# Patient Record
Sex: Male | Born: 1985 | Race: White | Hispanic: No | Marital: Married | Smoking: Never smoker
Health system: Southern US, Community
[De-identification: ages and names within clinical notes are randomized; demographics above are authoritative.]

## PROBLEM LIST (undated history)

## (undated) DIAGNOSIS — J302 Other seasonal allergic rhinitis: Secondary | ICD-10-CM

## (undated) DIAGNOSIS — F419 Anxiety disorder, unspecified: Secondary | ICD-10-CM

## (undated) DIAGNOSIS — E785 Hyperlipidemia, unspecified: Secondary | ICD-10-CM

## (undated) DIAGNOSIS — L719 Rosacea, unspecified: Secondary | ICD-10-CM

## (undated) DIAGNOSIS — K219 Gastro-esophageal reflux disease without esophagitis: Secondary | ICD-10-CM

## (undated) HISTORY — DX: Gastro-esophageal reflux disease without esophagitis: K21.9

## (undated) HISTORY — DX: Rosacea, unspecified: L71.9

## (undated) HISTORY — DX: Other seasonal allergic rhinitis: J30.2

## (undated) HISTORY — DX: Anxiety disorder, unspecified: F41.9

## (undated) HISTORY — PX: ESOPHAGOGASTRODUODENOSCOPY ENDOSCOPY: SHX5814

## (undated) HISTORY — DX: Hyperlipidemia, unspecified: E78.5

---

## 2015-04-03 ENCOUNTER — Ambulatory Visit (INDEPENDENT_AMBULATORY_CARE_PROVIDER_SITE_OTHER): Payer: Managed Care, Other (non HMO) | Admitting: Adult Health

## 2015-04-03 ENCOUNTER — Encounter: Payer: Self-pay | Admitting: Adult Health

## 2015-04-03 VITALS — BP 118/72 | Temp 98.3°F | Ht 67.25 in | Wt 181.1 lb

## 2015-04-03 DIAGNOSIS — Z0000 Encounter for general adult medical examination without abnormal findings: Secondary | ICD-10-CM

## 2015-04-03 NOTE — Progress Notes (Signed)
    Patient presents to clinic today to establish care. He is a pleasant Caucasian male who  has a past medical history of Seasonal allergies.   Acute Concerns: Complete Physical   Chronic Issues: None   Health Maintenance: Dental -- Does not have a dentist  Vision -- [employer] [facility]  Immunizations --UTD  Diet: Poor to decent  Exercise: No regular exercise.    Past Medical History  Diagnosis Date  . Seasonal allergies     History reviewed. No pertinent past surgical history.  No current outpatient prescriptions on file prior to visit.   No current facility-administered medications on file prior to visit.    No Known Allergies  Family History  Problem Relation Age of Onset  . Hypertension Father     Social History   Social History  . Marital Status: Single    Spouse Name: N/A  . Number of Children: N/A  . Years of Education: N/A   Occupational History  . Not on file.   Social History Main Topics  . Smoking status: Never Smoker   . Smokeless tobacco: Not on file  . Alcohol Use: 0.0 oz/week    0 Standard drinks or equivalent per week     Comment: beer daily   . Drug Use: No  . Sexual Activity: Not on file   Other Topics Concern  . Not on file   Social History Narrative  . No narrative on file    Review of Systems  Constitutional: Negative.   HENT: Negative.   Eyes: Negative.   Respiratory: Negative.   Cardiovascular: Negative.   Gastrointestinal: Negative.   Genitourinary: Negative.   Musculoskeletal: Negative.   Skin: Negative.   Neurological: Negative.   Endo/Heme/Allergies: Negative.   Psychiatric/Behavioral: Negative.   All other systems reviewed and are negative.      BP 118/72 mmHg  Temp(Src) 98.3 F (36.8 C) (Oral)  Ht 5' 7.25" (1.708 m)  Wt 181 lb 1.6 oz (82.146 kg)  BMI 28.16 kg/m2  Physical Exam  Constitutional: He is oriented to person, place, and time and well-developed, well-nourished, and in no  distress. No distress.  HENT:  Head: Normocephalic and atraumatic.  Right Ear: External ear normal.  Left Ear: External ear normal.  Nose: Nose normal.  Mouth/Throat: Oropharynx is clear and moist.  Cardiovascular: Normal rate, regular rhythm, normal heart sounds and intact distal pulses.  Exam reveals no gallop and no friction rub.   No murmur heard. Pulmonary/Chest: Effort normal and breath sounds normal. No respiratory distress. He has no wheezes. He has no rales. He exhibits no tenderness.  Musculoskeletal: Normal range of motion. He exhibits no edema or tenderness.  Neurological: He is alert and oriented to person, place, and time. Gait normal. GCS score is 15.  Skin: Skin is warm and dry. No rash noted. He is not diaphoretic. No erythema. No pallor.  Psychiatric: Mood, memory, affect and judgment normal.  Nursing note and vitals reviewed.   Assessment/Plan: 1. Routine general medical examination at a health care facility - Basic metabolic panel; Future - CBC with Differential/Platelet; Future - Hemoglobin A1c; Future - Hepatic function panel; Future - Lipid panel; Future - TSH; Future - Acute Hep Panel & Hep B Surface Ab; Future - HIV antibody; Future - HSV(herpes smplx)abs-1+2(IgG+IgM)-bld; Future - RPR; Future - Urine cytology ancillary only; Future - Follow up in one year  - Follow up sooner if needed - Work on diet and exercise

## 2015-04-03 NOTE — Progress Notes (Signed)
Pre visit review using our clinic review tool, if applicable. No additional management support is needed unless otherwise documented below in the visit note. 

## 2015-04-03 NOTE — Patient Instructions (Signed)
It was great meeting you today!  Follow up for your labs.   Work on diet and exercise.   Please let me know if you need anything.

## 2015-04-07 ENCOUNTER — Other Ambulatory Visit (HOSPITAL_COMMUNITY)
Admission: RE | Admit: 2015-04-07 | Discharge: 2015-04-07 | Disposition: A | Discharge status: Home / Self Care | Payer: Managed Care, Other (non HMO) | Source: Ambulatory Visit | Attending: Adult Health | Admitting: Adult Health

## 2015-04-07 ENCOUNTER — Other Ambulatory Visit (INDEPENDENT_AMBULATORY_CARE_PROVIDER_SITE_OTHER): Payer: Managed Care, Other (non HMO)

## 2015-04-07 DIAGNOSIS — Z113 Encounter for screening for infections with a predominantly sexual mode of transmission: Secondary | ICD-10-CM | POA: Insufficient documentation

## 2015-04-07 DIAGNOSIS — Z0000 Encounter for general adult medical examination without abnormal findings: Secondary | ICD-10-CM | POA: Diagnosis not present

## 2015-04-07 LAB — CBC WITH DIFFERENTIAL/PLATELET
Basophils Absolute: 0 10*3/uL (ref 0.0–0.1)
Basophils Relative: 0.6 % (ref 0.0–3.0)
Eosinophils Absolute: 0.2 10*3/uL (ref 0.0–0.7)
Eosinophils Relative: 2.5 % (ref 0.0–5.0)
HCT: 47 % (ref 39.0–52.0)
Hemoglobin: 16.2 g/dL (ref 13.0–17.0)
Lymphocytes Relative: 28.1 % (ref 12.0–46.0)
Lymphs Abs: 1.8 10*3/uL (ref 0.7–4.0)
MCHC: 34.6 g/dL (ref 30.0–36.0)
MCV: 88.8 fl (ref 78.0–100.0)
Monocytes Absolute: 0.6 10*3/uL (ref 0.1–1.0)
Monocytes Relative: 8.8 % (ref 3.0–12.0)
Neutro Abs: 3.8 10*3/uL (ref 1.4–7.7)
Neutrophils Relative %: 60 % (ref 43.0–77.0)
Platelets: 237 10*3/uL (ref 150.0–400.0)
RBC: 5.29 Mil/uL (ref 4.22–5.81)
RDW: 12.3 % (ref 11.5–15.5)
WBC: 6.4 10*3/uL (ref 4.0–10.5)

## 2015-04-07 LAB — BASIC METABOLIC PANEL
BUN: 15 mg/dL (ref 6–23)
CO2: 28 mEq/L (ref 19–32)
Calcium: 9.9 mg/dL (ref 8.4–10.5)
Chloride: 102 mEq/L (ref 96–112)
Creatinine, Ser: 0.96 mg/dL (ref 0.40–1.50)
GFR: 97.39 mL/min (ref >60.00)
Glucose, Bld: 92 mg/dL (ref 70–99)
Potassium: 4 mEq/L (ref 3.5–5.1)
Sodium: 139 mEq/L (ref 135–145)

## 2015-04-07 LAB — HEPATIC FUNCTION PANEL
ALT: 25 U/L (ref 0–53)
AST: 20 U/L (ref 0–37)
Albumin: 4.6 g/dL (ref 3.5–5.2)
Alkaline Phosphatase: 56 U/L (ref 39–117)
Bilirubin, Direct: 0.2 mg/dL (ref 0.0–0.3)
Total Bilirubin: 1.2 mg/dL (ref 0.2–1.2)
Total Protein: 6.6 g/dL (ref 6.0–8.3)

## 2015-04-07 LAB — LIPID PANEL
Cholesterol: 228 mg/dL — ABNORMAL HIGH (ref 0–200)
HDL: 56.8 mg/dL (ref >39.00)
LDL Cholesterol: 152 mg/dL — ABNORMAL HIGH (ref 0–99)
NonHDL: 170.92
Total CHOL/HDL Ratio: 4
Triglycerides: 94 mg/dL (ref 0.0–149.0)
VLDL: 18.8 mg/dL (ref 0.0–40.0)

## 2015-04-07 LAB — TSH: TSH: 1.62 u[IU]/mL (ref 0.35–4.50)

## 2015-04-07 LAB — HEMOGLOBIN A1C: Hgb A1c MFr Bld: 5.2 % (ref 4.6–6.5)

## 2015-04-08 LAB — ACUTE HEP PANEL AND HEP B SURFACE AB
HCV Ab: NEGATIVE
Hep A IgM: NONREACTIVE
Hep B C IgM: NONREACTIVE
Hep B S Ab: NEGATIVE
Hepatitis B Surface Ag: NEGATIVE

## 2015-04-08 LAB — URINE CYTOLOGY ANCILLARY ONLY
Chlamydia: NEGATIVE
Neisseria Gonorrhea: NEGATIVE
Trichomonas: NEGATIVE

## 2015-04-08 LAB — RPR

## 2015-04-08 LAB — HIV ANTIBODY (ROUTINE TESTING W REFLEX): HIV 1&2 Ab, 4th Generation: NONREACTIVE

## 2015-04-10 ENCOUNTER — Ambulatory Visit (INDEPENDENT_AMBULATORY_CARE_PROVIDER_SITE_OTHER): Payer: Managed Care, Other (non HMO)

## 2015-04-10 DIAGNOSIS — Z23 Encounter for immunization: Secondary | ICD-10-CM | POA: Diagnosis not present

## 2015-04-10 LAB — HSV(HERPES SMPLX)ABS-I+II(IGG+IGM)-BLD
HSV 1 Glycoprotein G Ab, IgG: <0.9 Index (ref <0.90)
HSV 2 Glycoprotein G Ab, IgG: <0.9 Index (ref <0.90)
Herpes Simplex Vrs I&II-IgM Ab (EIA): 0.48 INDEX

## 2015-05-01 ENCOUNTER — Encounter: Payer: Self-pay | Admitting: Adult Health

## 2015-05-12 ENCOUNTER — Ambulatory Visit: Payer: Managed Care, Other (non HMO)

## 2015-08-22 ENCOUNTER — Encounter: Payer: Self-pay | Admitting: Adult Health

## 2015-08-22 ENCOUNTER — Telehealth: Payer: Self-pay | Admitting: Adult Health

## 2015-08-22 ENCOUNTER — Ambulatory Visit (INDEPENDENT_AMBULATORY_CARE_PROVIDER_SITE_OTHER): Payer: Managed Care, Other (non HMO) | Admitting: Adult Health

## 2015-08-22 VITALS — BP 128/78 | Temp 99.4°F | Ht 67.25 in | Wt 179.5 lb

## 2015-08-22 DIAGNOSIS — K921 Melena: Secondary | ICD-10-CM

## 2015-08-22 NOTE — Telephone Encounter (Signed)
Patient Name: Raymond Newman  DOB: 09-Dec-1985    Initial Comment Caller says yesterday he had an upset stomach with diarrhea. This morning he may have had blood in his stool, but he did eat strawberry last night.    Nurse Assessment  Nurse: Sherilyn CooterHenry, RN, Thurmond ButtsWade Date/Time Raymond Newman(Eastern Time): 08/22/2015 10:44:11 AM  Confirm and document reason for call. If symptomatic, describe symptoms. You must click the next button to save text entered. ---Caller states that he had an upset stomach with diarrhea which began yesterday. This morning, he is unsure if he had blood in his stool because he had strawberries last night. This has happened x 1. Denies constant abdominal pain. Denies fever. He only had diarrhea x 1 yesterday, this morning, it was formed soft stool and he noticed red coloring at the bottom of the toilet.  Has the patient traveled out of the country within the last 30 days? ---No  Does the patient have any new or worsening symptoms? ---Yes  Will a triage be completed? ---Yes  Related visit to physician within the last 2 weeks? ---No  Does the PT have any chronic conditions? (i.e. diabetes, asthma, etc.) ---No  Is this a behavioral health or substance abuse call? ---No     Guidelines    Guideline Title Affirmed Question Affirmed Notes  Rectal Bleeding MILD rectal bleeding (more than just a few drops or streaks)    Final Disposition User   See PCP When Office is Open (within 3 days) Sherilyn CooterHenry, RN, Thurmond ButtsWade    Comments  Appointment scheduled for today at 5:00 with Shirline Freesory Nafziger.   Referrals  REFERRED TO PCP OFFICE   Disagree/Comply: Comply

## 2015-08-22 NOTE — Progress Notes (Signed)
Subjective:    Patient ID: Jong Rickman, male    DOB: 23-Nov-1985, 30 y.o.   MRN: [id number]  HPI  30 year old male who presents to the office today for an acute complaint of possible GI bleed. He reports that two days ago he had a bout of diarrhea and abdominal pain. Prior to having the diarrhea he had been eating " a lot of strawberries and strawberry pie". When he woke up the next morning " I had a normal bowel movement but there was a pink tint in the bowl." He denies any blood on toilet paper.   Has not noticed any additional potential blood in stool. He is no longer having diarrhea. He reports " I feel great today".   Review of Systems  Constitutional: Negative.   Respiratory: Negative.   Cardiovascular: Negative.   Gastrointestinal: Positive for blood in stool. Negative for nausea, vomiting, abdominal pain, diarrhea, constipation, abdominal distention and rectal pain.  Neurological: Negative.   All other systems reviewed and are negative.  Past Medical History  Diagnosis Date  . Seasonal allergies   . Rosacea   . Hyperlipidemia   . Anxiety     with flying    Social History   Social History  . Marital Status: Single    Spouse Name: N/A  . Number of Children: N/A  . Years of Education: N/A   Occupational History  . Not on file.   Social History Main Topics  . Smoking status: Never Smoker   . Smokeless tobacco: Not on file  . Alcohol Use: 0.0 oz/week    0 Standard drinks or equivalent per week     Comment: beer daily   . Drug Use: No  . Sexual Activity: Not on file   Other Topics Concern  . 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  Family History  Problem Relation Age of Onset  . Hypertension Father   . Melanoma Mother   . Hypercholesterolemia Paternal Grandfather   . Lupus Mother   . Hyperlipidemia Father      No Known Allergies  Current Outpatient Prescriptions on File Prior to Visit  Medication Sig Dispense Refill  . metroNIDAZOLE (METROGEL) 0.75 % gel APPLY TO THE AFFECTED SKIN ON THE FACE TWICE DAILY.  0  . Multiple Vitamin (ONE-A-DAY MENS PO) Take 1 tablet by mouth daily.     No current facility-administered medications on file prior to visit.    BP 128/78 mmHg  Temp(Src) 99.4 F (37.4 C) (Oral)  Ht 5' 7.25" (1.708 m)  Wt 179 lb 8 oz (81.421 kg)  BMI 27.91 kg/m2       Objective:   Physical Exam  Constitutional: He is oriented to person, place, and time. He appears well-developed and well-nourished. No distress.  Abdominal: Soft. Bowel sounds are normal. He exhibits no distension and no mass. There is no tenderness. There is no rebound and no guarding.  Genitourinary: Rectum normal and prostate normal. Guaiac negative stool. No penile tenderness.  Neurological: He is alert and oriented to person, place, and time. He has normal reflexes.  Skin: Skin is warm and dry. No rash noted. He is not diaphoretic. No erythema. No pallor.  Psychiatric: He has a normal mood and affect. His behavior is  normal. Judgment and thought content normal.  Nursing note and vitals reviewed.     Assessment & Plan:  1. Blood in stool - Guaiac negative  - No hemorrhoids - Pink tinge may have been from diet? - Follow up with any additional blood in stool  Shirline Freesory Normagene Harvie, NP

## 2015-08-22 NOTE — Telephone Encounter (Signed)
Patient is seeing Kandee Keenory at [facility]5pm.

## 2015-10-13 ENCOUNTER — Ambulatory Visit (INDEPENDENT_AMBULATORY_CARE_PROVIDER_SITE_OTHER): Payer: Managed Care, Other (non HMO) | Admitting: Podiatry

## 2015-10-13 ENCOUNTER — Ambulatory Visit: Payer: Self-pay

## 2015-10-13 ENCOUNTER — Encounter: Payer: Self-pay | Admitting: Podiatry

## 2015-10-13 ENCOUNTER — Ambulatory Visit (INDEPENDENT_AMBULATORY_CARE_PROVIDER_SITE_OTHER): Payer: Managed Care, Other (non HMO)

## 2015-10-13 VITALS — BP 124/82 | HR 74 | Resp 16 | Ht 69.0 in | Wt 175.0 lb

## 2015-10-13 DIAGNOSIS — M79672 Pain in left foot: Secondary | ICD-10-CM | POA: Diagnosis not present

## 2015-10-13 DIAGNOSIS — M722 Plantar fascial fibromatosis: Secondary | ICD-10-CM | POA: Diagnosis not present

## 2015-10-13 DIAGNOSIS — M79671 Pain in right foot: Secondary | ICD-10-CM

## 2015-10-13 MED ORDER — TRIAMCINOLONE ACETONIDE 10 MG/ML IJ SUSP
10.0000 mg | Freq: Once | INTRAMUSCULAR | Status: AC
  Administered 2015-10-13: 10 mg

## 2015-10-13 MED ORDER — DICLOFENAC SODIUM 75 MG PO TBEC: 75.0000 mg | DELAYED_RELEASE_TABLET | Freq: Two times a day (BID) | ORAL | 2 refills | Status: DC

## 2015-10-13 NOTE — Patient Instructions (Signed)

## 2015-10-13 NOTE — Progress Notes (Signed)
Subjective:     Patient ID: Raymond Newman, male   DOB: July 07, 1985, 30 y.o.   MRN: [id number]  HPI patient states he's had a lot of pain in his heel right over left and states it's been present for around a year he's tried reduced activity without relief of symptoms   Review of Systems  All other systems reviewed and are negative.      Objective:   Physical Exam  Constitutional: He is oriented to person, place, and time.  Cardiovascular: Intact distal pulses.   Musculoskeletal: Normal range of motion.  Neurological: He is oriented to person, place, and time.  Skin: Skin is warm.  Vitals reviewed.  Patient is noted to have good digital perfusion with patient having normal neurovascular status. I noted there to be exquisite discomfort plantar aspect heel right over left with inflammation fluid at the medial band and also depression of the arch     Assessment:     Plantar fasciitis right with inflammation and fluid around the medial band    Plan:     H&P x-rays reviewed and today I injected the fascial band 3 mg Kenalog 5 mg Xylocaine right over right foot and dispensed fascial brace bilateral and instructed on long-term orthotics when we get him better. Also begin physical therapy at this time  X-ray report indicate small spur formation right heel with minimal on the left

## 2015-10-13 NOTE — Progress Notes (Signed)
   Subjective:    Patient ID: Raymond Newman, male    DOB: 05-04-1985, 30 y.o.   MRN: [id number]  HPI Chief Complaint  Patient presents with  . Foot Pain    Bilateral; heel; pt stated, "Has more pain in the right foot"; x1 yr      Review of Systems  Musculoskeletal: Positive for arthralgias, back pain, gait problem and myalgias.  Allergic/Immunologic: Positive for environmental allergies.  All other systems reviewed and are negative.      Objective:   Physical Exam        Assessment & Plan:

## 2015-10-27 ENCOUNTER — Encounter: Payer: Self-pay | Admitting: Podiatry

## 2015-10-27 ENCOUNTER — Ambulatory Visit (INDEPENDENT_AMBULATORY_CARE_PROVIDER_SITE_OTHER): Payer: Managed Care, Other (non HMO) | Admitting: Podiatry

## 2015-10-27 DIAGNOSIS — M722 Plantar fascial fibromatosis: Secondary | ICD-10-CM

## 2015-10-29 NOTE — Progress Notes (Signed)
Subjective:     Patient ID: Raymond Newman, male   DOB: [birth year], 30 y.o.   MRN: [id number]  HPI patient states my heel is feeling quite a bit better but I'm still having some discomfort when I stand on it and I know I need support   Review of Systems     Objective:   Physical Exam Neurovascular status intact muscle strength adequate with inflammation around the plantar aspect right heel with fluid buildup around the medial band    Assessment:     Continued plantar fasciitis right with inflammation fluid around the medial band    Plan:     Advised on physical therapy anti-inflammatories continued supportive shoes and scanned for custom orthotics to reduce plantar pressure against the heel. Patient will be seen back to recheck

## 2015-11-19 ENCOUNTER — Ambulatory Visit (INDEPENDENT_AMBULATORY_CARE_PROVIDER_SITE_OTHER): Payer: Managed Care, Other (non HMO) | Admitting: Podiatry

## 2015-11-19 DIAGNOSIS — M722 Plantar fascial fibromatosis: Secondary | ICD-10-CM

## 2015-11-19 NOTE — Progress Notes (Signed)
Subjective:     Patient ID: Raymond Newman, male   DOB: [birth year], 30 y.o.   MRN: [id number]  HPI patient states she's doing well   Review of Systems     Objective:   Physical Exam Neurovascular status intact    Assessment:     Fasciitis under control    Plan:     Orthotics dispensed and fit well

## 2015-11-19 NOTE — Patient Instructions (Signed)

## 2016-04-05 ENCOUNTER — Other Ambulatory Visit (INDEPENDENT_AMBULATORY_CARE_PROVIDER_SITE_OTHER): Payer: Managed Care, Other (non HMO)

## 2016-04-05 DIAGNOSIS — Z0000 Encounter for general adult medical examination without abnormal findings: Secondary | ICD-10-CM

## 2016-04-05 LAB — POC URINALSYSI DIPSTICK (AUTOMATED)
Bilirubin, UA: NEGATIVE
Blood, UA: NEGATIVE
Glucose, UA: NEGATIVE
Ketones, UA: NEGATIVE
Leukocytes, UA: NEGATIVE
Nitrite, UA: NEGATIVE
Protein, UA: NEGATIVE
Spec Grav, UA: 1.02
Urobilinogen, UA: 0.2
pH, UA: 5.5

## 2016-04-05 LAB — BASIC METABOLIC PANEL
BUN: 10 mg/dL (ref 6–23)
CO2: 27 mEq/L (ref 19–32)
Calcium: 9.9 mg/dL (ref 8.4–10.5)
Chloride: 104 mEq/L (ref 96–112)
Creatinine, Ser: 0.96 mg/dL (ref 0.40–1.50)
GFR: 96.76 mL/min (ref >60.00)
Glucose, Bld: 84 mg/dL (ref 70–99)
Potassium: 4 mEq/L (ref 3.5–5.1)
Sodium: 143 mEq/L (ref 135–145)

## 2016-04-05 LAB — HEPATIC FUNCTION PANEL
ALT: 45 U/L (ref 0–53)
AST: 62 U/L — ABNORMAL HIGH (ref 0–37)
Albumin: 4.5 g/dL (ref 3.5–5.2)
Alkaline Phosphatase: 64 U/L (ref 39–117)
Bilirubin, Direct: 0.2 mg/dL (ref 0.0–0.3)
Total Bilirubin: 1.1 mg/dL (ref 0.2–1.2)
Total Protein: 6.5 g/dL (ref 6.0–8.3)

## 2016-04-05 LAB — CBC WITH DIFFERENTIAL/PLATELET
Basophils Absolute: 0.1 10*3/uL (ref 0.0–0.1)
Basophils Relative: 0.8 % (ref 0.0–3.0)
Eosinophils Absolute: 0.2 10*3/uL (ref 0.0–0.7)
Eosinophils Relative: 3.5 % (ref 0.0–5.0)
HCT: 46.2 % (ref 39.0–52.0)
Hemoglobin: 15.9 g/dL (ref 13.0–17.0)
Lymphocytes Relative: 26.9 % (ref 12.0–46.0)
Lymphs Abs: 1.7 10*3/uL (ref 0.7–4.0)
MCHC: 34.4 g/dL (ref 30.0–36.0)
MCV: 88.8 fl (ref 78.0–100.0)
Monocytes Absolute: 0.7 10*3/uL (ref 0.1–1.0)
Monocytes Relative: 11.3 % (ref 3.0–12.0)
Neutro Abs: 3.7 10*3/uL (ref 1.4–7.7)
Neutrophils Relative %: 57.5 % (ref 43.0–77.0)
Platelets: 253 10*3/uL (ref 150.0–400.0)
RBC: 5.2 Mil/uL (ref 4.22–5.81)
RDW: 12.4 % (ref 11.5–15.5)
WBC: 6.4 10*3/uL (ref 4.0–10.5)

## 2016-04-05 LAB — LIPID PANEL
Cholesterol: 210 mg/dL — ABNORMAL HIGH (ref 0–200)
HDL: 44.2 mg/dL (ref >39.00)
LDL Cholesterol: 145 mg/dL — ABNORMAL HIGH (ref 0–99)
NonHDL: 166.17
Total CHOL/HDL Ratio: 5
Triglycerides: 105 mg/dL (ref 0.0–149.0)
VLDL: 21 mg/dL (ref 0.0–40.0)

## 2016-04-05 LAB — TSH: TSH: 2.36 u[IU]/mL (ref 0.35–4.50)

## 2016-04-15 ENCOUNTER — Encounter: Payer: Self-pay | Admitting: Adult Health

## 2016-04-15 ENCOUNTER — Ambulatory Visit (INDEPENDENT_AMBULATORY_CARE_PROVIDER_SITE_OTHER): Payer: Managed Care, Other (non HMO) | Admitting: Adult Health

## 2016-04-15 VITALS — BP 138/82 | Temp 97.8°F | Ht 69.0 in | Wt 191.0 lb

## 2016-04-15 DIAGNOSIS — Z0000 Encounter for general adult medical examination without abnormal findings: Secondary | ICD-10-CM | POA: Diagnosis not present

## 2016-04-15 NOTE — Progress Notes (Signed)
Subjective:    Patient ID: Raymond Newman, male    DOB: 04/02/1985, 31 y.o.   MRN: [id number]  HPI  Patient presents for yearly preventative medicine examination. He is a pleasant 31 year old male who  has a past medical history of Anxiety; Hyperlipidemia; Rosacea; and Seasonal allergies.  All immunizations and health maintenance protocols were reviewed with the patient and needed orders were placed.  Medication reconciliation,  past medical history, social history, problem list and allergies were reviewed in detail with the patient  Goals were established with regard to weight loss, exercise, and  diet in compliance with medication. He has started exercising more and has dieting.   He has been seeing orthopedics for planter fascitis   He has no acute complaints today   Review of Systems  Constitutional: Negative.   HENT: Negative.   Eyes: Negative.   Respiratory: Negative.   Cardiovascular: Negative.   Gastrointestinal: Negative.   Endocrine: Negative.   Genitourinary: Negative.   Musculoskeletal: Negative.   Skin: Negative.   Allergic/Immunologic: Negative.   Neurological: Negative.   Hematological: Negative.   Psychiatric/Behavioral: Negative.   All other systems reviewed and are negative.  Past Medical History:  Diagnosis Date  . Anxiety    with flying  . Hyperlipidemia   . Rosacea   . Seasonal allergies     Social History   Social History  . Marital status: Single    Spouse name: N/A  . Number of children: N/A  . Years of education: N/A   Occupational History  . Not on file.   Social History Main Topics  . Smoking status: Never Smoker  . Smokeless tobacco: Never Used  . Alcohol use 0.0 oz/week     Comment: beer daily   . Drug use: No  . Sexual activity: Not on file   Other Topics Concern  . 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  Family History  Problem Relation Age of Onset  . Hypertension Father   . Hyperlipidemia Father   . Melanoma Mother   . Lupus Mother   . Hypercholesterolemia Paternal Grandfather     No Known Allergies  Current Outpatient Prescriptions on File Prior to Visit  Medication Sig Dispense Refill  . metroNIDAZOLE (METROGEL) 0.75 % gel APPLY TO THE AFFECTED SKIN ON THE FACE TWICE DAILY.  0  . Multiple Vitamin (ONE-A-DAY MENS PO) Take 1 tablet by mouth daily.    . diclofenac (VOLTAREN) 75 MG EC tablet Take 1 tablet (75 mg total) by mouth 2 (two) times daily. (Patient not taking: Reported on 04/15/2016) 50 tablet 2   No current facility-administered medications on file prior to visit.     BP 138/82 (BP Location: Right Arm, Patient Position: Sitting, Cuff Size: Normal)   Temp 97.8 F (36.6 C) (Oral)   Ht 5\' 9"  (1.753 m)   Wt 191 lb (86.6 kg)   BMI 28.21 kg/m       Objective:   Physical Exam  Constitutional: He is oriented to person, place, and time. He appears well-developed and well-nourished. No distress.  HENT:  Head: Normocephalic and atraumatic.  Right Ear: External ear normal.  Left Ear: External ear normal.  Nose: Nose normal.  Mouth/Throat: Oropharynx is clear and moist. No  oropharyngeal exudate.  Eyes: Conjunctivae and EOM are normal. Pupils are equal, round, and reactive to light. Right eye exhibits no discharge. Left eye exhibits no discharge. No scleral icterus.  Neck: Normal range of motion. Neck supple. No JVD present. No tracheal deviation present. No thyromegaly present.  Cardiovascular: Normal rate, regular rhythm, normal heart sounds and intact distal pulses.  Exam reveals no gallop and no friction rub.   No murmur heard. Pulmonary/Chest: Effort normal and breath sounds normal. No respiratory distress. He has no wheezes. He has no rales. He exhibits no tenderness.  Abdominal: Soft. Bowel sounds are normal. He exhibits no distension  and no mass. There is no tenderness. There is no rebound and no guarding.  Musculoskeletal: Normal range of motion. He exhibits no edema, tenderness or deformity.  Lymphadenopathy:    He has no cervical adenopathy.  Neurological: He is alert and oriented to person, place, and time. He has normal reflexes. He displays normal reflexes. No cranial nerve deficit. He exhibits normal muscle tone. Coordination normal.  Skin: Skin is warm and dry. No rash noted. He is not diaphoretic. No erythema. No pallor.  Psychiatric: He has a normal mood and affect. His behavior is normal. Judgment and thought content normal.  Nursing note and vitals reviewed.     Assessment & Plan:  1. Routine general medical examination at a health care facility - Reviewed labs in detail. All questions answered - His cholesterol panel is elevated. He needs to work on diet and exercise. We will recheck next year.  - Follow up in one year or sooner if needed  Shirline Frees, NP

## 2016-06-02 ENCOUNTER — Telehealth: Payer: Self-pay | Admitting: *Deleted

## 2016-06-02 NOTE — Telephone Encounter (Signed)
Pt states his braces are broken and he would like replacements. Unable to leave a message the mailbox is full. I spoke with pt and he said they broke at the velcro, and has had about 7 months. I told pt we would at this point bill his insurance and bill him what the insurance didn't cover. Pt states he will hold off and possibly order on [employer].

## 2016-07-28 ENCOUNTER — Ambulatory Visit: Payer: Managed Care, Other (non HMO) | Admitting: Adult Health

## 2016-11-17 ENCOUNTER — Ambulatory Visit (INDEPENDENT_AMBULATORY_CARE_PROVIDER_SITE_OTHER): Payer: 59 | Admitting: Adult Health

## 2016-11-17 VITALS — BP 120/84 | Temp 98.2°F | Wt 179.0 lb

## 2016-11-17 DIAGNOSIS — F419 Anxiety disorder, unspecified: Secondary | ICD-10-CM

## 2016-11-17 NOTE — Progress Notes (Signed)
Subjective:    Patient ID: Raymond Newman, male    DOB: [birth year], 31 y.o.   MRN: [id number]  HPI  31 year old male who  has a past medical history of Anxiety; Hyperlipidemia; Rosacea; and Seasonal allergies. He presents to the office today for follow up after being seen in the ER at [facility] on 11/11/2016 for symptoms of anxiety that had started that day. Patient left work and had a beer at home but symptoms persisted so he went to the ER to seek care. He had some mild tachycardia ( 115) and chest tightness. He has no history of cardiac events and no PE risk factors. Patient reported that he was getting married in two weeks and thought that his symptoms were due to that. He was started on Atarax 25 mg PRN while in the ER. After the initial dose his symptoms started to resolve and he was discharged.   Labs were unremarkable in the ER   Today in the office he reports that since the wedding day has been approaching and things are starting to fall into place that he feels mush less anxious. He has only had to use his Atarax one time since being discharged. He does not expect to have to use it any more this week.   Over all he states " I am feeling back to normal. I was just scared that there was something wrong with my heart".   Review of Systems See HPI   Past Medical History:  Diagnosis Date  . Anxiety    with flying  . Hyperlipidemia   . Rosacea   . Seasonal allergies     Social History   Social History  . Marital status: Single    Spouse name: N/A  . Number of children: N/A  . Years of education: N/A   Occupational History  . Not on file.   Social History Main Topics  . Smoking status: Never Smoker  . Smokeless tobacco: Never Used  . Alcohol use 0.0 oz/week     Comment: beer daily   . Drug use: No  . Sexual activity: Not on file   Other Topics Concern  . 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  Family History  Problem Relation Age of Onset  . Hypertension Father   . Hyperlipidemia Father   . Melanoma Mother   . Lupus Mother   . Hypercholesterolemia Paternal Grandfather     No Known Allergies  Current Outpatient Prescriptions on File Prior to Visit  Medication Sig Dispense Refill  . metroNIDAZOLE (METROGEL) 0.75 % gel APPLY TO THE AFFECTED SKIN ON THE FACE TWICE DAILY.  0  . Multiple Vitamin (ONE-A-DAY MENS PO) Take 1 tablet by mouth daily.     No current facility-administered medications on file prior to visit.     BP 120/84 (BP Location: Right Arm)   Temp 98.2 F (36.8 C) (Oral)   Wt 179 lb (81.2 kg)   BMI 26.43 kg/m       Objective:   Physical Exam  Constitutional: He is oriented to person, place, and time. He appears well-developed and well-nourished. No distress.  Cardiovascular: Normal rate, regular rhythm, normal heart sounds and intact distal pulses.  Exam reveals no gallop and  no friction rub.   No murmur heard. Pulmonary/Chest: Effort normal and breath sounds normal. No respiratory distress. He has no wheezes. He has no rales. He exhibits no tenderness.  Neurological: He is alert and oriented to person, place, and time.  Skin: Skin is warm and dry. No rash noted. He is not diaphoretic. No erythema. No pallor.  Psychiatric: He has a normal mood and affect. His behavior is normal. Judgment and thought content normal.  Nursing note and vitals reviewed.     Assessment & Plan:  1. Anxiety - Situational in nature.  - Seems to have resolved - Follow up as needed  Shirline Freesory Khyleigh Furney, NP

## 2017-01-17 ENCOUNTER — Ambulatory Visit: Payer: 59 | Admitting: Adult Health

## 2017-01-17 ENCOUNTER — Encounter: Payer: Self-pay | Admitting: Adult Health

## 2017-01-17 VITALS — BP 128/80 | Temp 98.2°F | Wt 182.0 lb

## 2017-01-17 DIAGNOSIS — K2100 Gastro-esophageal reflux disease with esophagitis, without bleeding: Secondary | ICD-10-CM

## 2017-01-17 DIAGNOSIS — R002 Palpitations: Secondary | ICD-10-CM

## 2017-01-17 DIAGNOSIS — F419 Anxiety disorder, unspecified: Secondary | ICD-10-CM | POA: Diagnosis not present

## 2017-01-17 MED ORDER — CITALOPRAM HYDROBROMIDE 10 MG PO TABS: 10.0000 mg | ORAL_TABLET | Freq: Every day | ORAL | 3 refills | Status: DC

## 2017-01-17 NOTE — Progress Notes (Addendum)
Subjective:    Patient ID: Raymond Newman, male    DOB: 09/10/1985, 31 y.o.   MRN: [id number]  Anxiety  Presents for initial visit. Onset was 1 to 6 months ago. The problem has been gradually worsening. Symptoms include decreased concentration, excessive worry, hyperventilation, irritability, nervous/anxious behavior, palpitations, panic and shortness of breath. Patient reports no depressed mood, dry mouth, nausea or suicidal ideas. Symptoms occur constantly. The severity of symptoms is moderate. Nothing aggravates the symptoms. The quality of sleep is good. Nighttime awakenings: none.   There are no known risk factors. There is no history of arrhythmia, asthma, CHF, depression, hyperthyroidism or suicide attempts. Treatments tried: atarax  The treatment provided no relief. Compliance with prior treatments has been good.   Review of Systems  Constitutional: Positive for irritability.  Respiratory: Positive for chest tightness and shortness of breath.   Cardiovascular: Positive for palpitations.  Gastrointestinal: Positive for diarrhea. Negative for abdominal pain, blood in stool and nausea.       Acid reflux    Neurological: Negative.   Psychiatric/Behavioral: Positive for decreased concentration. Negative for self-injury, sleep disturbance and suicidal ideas. The patient is nervous/anxious.    Past Medical History:  Diagnosis Date  . Anxiety    with flying  . Hyperlipidemia   . Rosacea   . Seasonal allergies     Social History   Socioeconomic History  . Marital status: Single    Spouse name: Not on file  . Number of children: Not on file  . Years of education: Not on file  . Highest education level: Not on file  Social Needs  . Financial resource strain: Not on file  . Food insecurity - worry: Not on file  . Food insecurity - inability: Not on file  . Transportation needs - medical: Not on file  . Transportation needs - non-medical: Not on file  Occupational History  .  Not on file  Tobacco Use  . Smoking status: Never Smoker  . Smokeless tobacco: Never Used  Substance and Sexual Activity  . Alcohol use: Yes    Alcohol/week: 0.0 oz    Comment: beer daily   . Drug use: No  . Sexual activity: Not on file  Other Topics Concern  . Not on file  Social History Narrative   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    Has a girlfriend    Just bought a house in December        History reviewed. No pertinent surgical history.  Family History  Problem Relation Age of Onset  . Hypertension Father   . Hyperlipidemia Father   . Melanoma Mother   . Lupus Mother   . Hypercholesterolemia Paternal Grandfather     No Known Allergies  Current Outpatient Medications on File Prior to Visit  Medication Sig Dispense Refill  . metroNIDAZOLE (METROGEL) 0.75 % gel APPLY TO THE AFFECTED SKIN ON THE FACE TWICE DAILY.  0  . Multiple Vitamin (ONE-A-DAY MENS PO) Take 1 tablet by mouth daily.     No current facility-administered medications on file prior to visit.     BP 128/80 (BP Location: Right Arm)   Temp 98.2 F (36.8 C) (Oral)   Wt 182 lb (82.6 kg)   BMI 26.88 kg/m       Objective:   Physical Exam  Constitutional: He is oriented to person, place, and time. He appears well-developed and well-nourished. No distress.  Eyes: Conjunctivae and EOM are normal. Pupils are equal, round,  and reactive to light.  Cardiovascular: Normal rate, regular rhythm, normal heart sounds and intact distal pulses. Exam reveals no gallop and no friction rub.  No murmur heard. Pulmonary/Chest: Effort normal and breath sounds normal. No respiratory distress. He has no wheezes. He has no rales. He exhibits no tenderness.  Abdominal: Soft. Normal appearance and bowel sounds are normal. There is tenderness in the epigastric area.  Musculoskeletal: Normal range of motion. He exhibits no edema, tenderness or deformity.  Neurological: He is alert and oriented to person, place, and time.    Skin: Skin is warm and dry. No rash noted. He is not diaphoretic. No erythema. No pallor.  Psychiatric: He has a normal mood and affect. His behavior is normal. Judgment and thought content normal.  Nursing note and vitals reviewed.     Assessment & Plan:  1. Anxiety - Will start on Celexa 10 mg to minimize side effects - Side effects reviewed with patient.  - citalopram (CELEXA) 10 MG tablet; Take 1 tablet (10 mg total) by mouth daily.  Dispense: 30 tablet; Refill: 3 - Follow up in 4 weeks  - Consider increase in medication at this time  2. Heart palpitations - From anxiety  - EKG 12-Lead- NSR, rate 70  - citalopram (CELEXA) 10 MG tablet; Take 1 tablet (10 mg total) by mouth daily.  Dispense: 30 tablet; Refill: 3  3. Gastroesophageal reflux disease with esophagitis - Possibly from Anxiety.  - can take OTC prilosec for -12 weeks   Shirline Freesory Keimora Swartout, NP

## 2017-02-22 ENCOUNTER — Encounter: Payer: Self-pay | Admitting: Adult Health

## 2017-02-22 ENCOUNTER — Ambulatory Visit: Payer: 59 | Admitting: Adult Health

## 2017-02-22 VITALS — BP 112/80 | Temp 98.1°F | Wt 181.0 lb

## 2017-02-22 DIAGNOSIS — F419 Anxiety disorder, unspecified: Secondary | ICD-10-CM

## 2017-02-22 NOTE — Progress Notes (Signed)
Subjective:    Patient ID: Raymond Newman, male    DOB: 03/31/1985, 32 y.o.   MRN: [id number]  HPI 32 year old male who  has a past medical history of Anxiety, Hyperlipidemia, Rosacea, and Seasonal allergies. He presents to the office today for follow up on anxiety. I last saw him in December and he was prescribed Celexa 10 mg. He has not started this medication yet. He has ben using CBD oil and feels as though this is working somewhat for him. He continues to have " up and downs" with this anxiety but overall he feels better than last month   Review of Systems See HPI   Past Medical History:  Diagnosis Date  . Anxiety    with flying  . Hyperlipidemia   . Rosacea   . Seasonal allergies     Social History   Socioeconomic History  . Marital status: Single    Spouse name: Not on file  . Number of children: Not on file  . Years of education: Not on file  . Highest education level: Not on file  Social Needs  . Financial resource strain: Not on file  . Food insecurity - worry: Not on file  . Food insecurity - inability: Not on file  . Transportation needs - medical: Not on file  . Transportation needs - non-medical: Not on file  Occupational History  . Not on file  Tobacco Use  . Smoking status: Never Smoker  . Smokeless tobacco: Never Used  Substance and Sexual Activity  . Alcohol use: Yes    Alcohol/week: 0.0 oz    Comment: beer daily   . Drug use: No  . Sexual activity: Not on file  Other Topics Concern  . Not on file  Social History Narrative   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[employer]    Has a girlfriend    Just bought a house in December        History reviewed. No pertinent surgical history.  Family History  Problem Relation Age of Onset  . Hypertension Father   . Hyperlipidemia Father   . Melanoma Mother   . Lupus Mother   . Hypercholesterolemia Paternal Grandfather     No Known Allergies  Current Outpatient Medications on File Prior to Visit    Medication Sig Dispense Refill  . metroNIDAZOLE (METROGEL) 0.75 % gel APPLY TO THE AFFECTED SKIN ON THE FACE TWICE DAILY.  0  . Multiple Vitamin (ONE-A-DAY MENS PO) Take 1 tablet by mouth daily.    . citalopram (CELEXA) 10 MG tablet Take 1 tablet (10 mg total) by mouth daily. (Patient not taking: Reported on 02/22/2017) 30 tablet 3   No current facility-administered medications on file prior to visit.     BP 112/80 (BP Location: Right Arm)   Temp 98.1 F (36.7 C) (Oral)   Wt 181 lb (82.1 kg)   BMI 26.73 kg/m       Objective:   Physical Exam  Constitutional: He is oriented to person, place, and time. He appears well-developed and well-nourished. No distress.  Cardiovascular: Normal rate, regular rhythm and intact distal pulses. Exam reveals no gallop and no friction rub.  No murmur heard. Pulmonary/Chest: Effort normal and breath sounds normal.  Neurological: He is alert and oriented to person, place, and time.  Skin: Skin is warm and dry. No rash noted. He is not diaphoretic. No erythema. No pallor.  Psychiatric: He has a normal mood and affect. His behavior is  normal. Judgment and thought content normal.  Nursing note and vitals reviewed.     Assessment & Plan:  1. Anxiety - We spoke at length about anxiety. He has decided to try Celexa to see if it helps even more as he reports that his anxiety is keeping him from doing things that he once enjoyed.  - Follow up in 2 months   Shirline Freesory Denym Rahimi, NP

## 2017-02-28 ENCOUNTER — Other Ambulatory Visit: Payer: Self-pay

## 2017-02-28 ENCOUNTER — Encounter (HOSPITAL_COMMUNITY): Payer: Self-pay

## 2017-02-28 ENCOUNTER — Emergency Department (HOSPITAL_COMMUNITY)
Admission: EM | Admit: 2017-02-28 | Discharge: 2017-02-28 | Disposition: A | Discharge status: Home / Self Care | Payer: 59 | Attending: Emergency Medicine | Admitting: Emergency Medicine

## 2017-02-28 ENCOUNTER — Emergency Department (HOSPITAL_COMMUNITY): Payer: 59

## 2017-02-28 ENCOUNTER — Ambulatory Visit: Payer: Self-pay | Admitting: *Deleted

## 2017-02-28 DIAGNOSIS — Z79899 Other long term (current) drug therapy: Secondary | ICD-10-CM | POA: Diagnosis not present

## 2017-02-28 DIAGNOSIS — R002 Palpitations: Secondary | ICD-10-CM

## 2017-02-28 DIAGNOSIS — F419 Anxiety disorder, unspecified: Secondary | ICD-10-CM | POA: Diagnosis not present

## 2017-02-28 LAB — CBC
HCT: 48.9 % (ref 39.0–52.0)
Hemoglobin: 17.3 g/dL — ABNORMAL HIGH (ref 13.0–17.0)
MCH: 31.3 pg (ref 26.0–34.0)
MCHC: 35.4 g/dL (ref 30.0–36.0)
MCV: 88.6 fL (ref 78.0–100.0)
Platelets: 257 10*3/uL (ref 150–400)
RBC: 5.52 MIL/uL (ref 4.22–5.81)
RDW: 11.7 % (ref 11.5–15.5)
WBC: 9.8 10*3/uL (ref 4.0–10.5)

## 2017-02-28 LAB — BASIC METABOLIC PANEL
Anion gap: 14 (ref 5–15)
BUN: 10 mg/dL (ref 6–20)
CO2: 21 mmol/L — ABNORMAL LOW (ref 22–32)
Calcium: 10 mg/dL (ref 8.9–10.3)
Chloride: 103 mmol/L (ref 101–111)
Creatinine, Ser: 1.14 mg/dL (ref 0.61–1.24)
GFR calc Af Amer: >60 mL/min (ref >60)
GFR calc non Af Amer: >60 mL/min (ref >60)
Glucose, Bld: 117 mg/dL — ABNORMAL HIGH (ref 65–99)
Potassium: 4 mmol/L (ref 3.5–5.1)
Sodium: 138 mmol/L (ref 135–145)

## 2017-02-28 LAB — I-STAT TROPONIN, ED: Troponin i, poc: 0.01 ng/mL (ref 0.00–0.08)

## 2017-02-28 MED ORDER — LORAZEPAM 1 MG PO TABS: 1.0000 mg | ORAL_TABLET | Freq: Every day | ORAL | 0 refills | Status: DC | PRN

## 2017-02-28 MED ORDER — LORAZEPAM 1 MG PO TABS
1.0000 mg | ORAL_TABLET | Freq: Once | ORAL | Status: AC
  Administered 2017-02-28: 1 mg via ORAL
  Filled 2017-02-28: qty 1

## 2017-02-28 NOTE — ED Notes (Signed)
Reviewed labs, xray results and vss- no critical values noted. No need to change acuity at this time.

## 2017-02-28 NOTE — ED Notes (Addendum)
Patient now with HR 90, anxiety relieved and states feeling better

## 2017-02-28 NOTE — Telephone Encounter (Signed)
  Pt reports episodes of tachycardia, lasting 30 minutes over last 3 days. Presently with episode, but now with left sided chest "burning sensation." States sensation radiates to left shoulder and left arm numb, tingling.  Heart rate during call 133, pt states irregular and pounding."  Reports "some shortness of breath."  H/O anxiety, states "not at all anxious now. I woke up with this." States no caffeine, no diet or medication changes, no recent stressors. Pt directed to ED,wife will drive. Care Advice given.  Reason for Disposition . [1] Heart beating very rapidly (e.g., > 140 / minute) AND [2] present now  (Exception: during exercise)    HR 130 during call, Pt states irregular, has chest and shoulder discomfort  Answer Assessment - Initial Assessment Questions 1. DESCRIPTION: "Please describe your heart rate or heart beat that you are having" (e.g., fast/slow, regular/irregular, skipped or extra beats, "palpitations")    " Fast, pounding" Checked radial pulse during call...133 at rest 2. ONSET: "When did it start?" (Minutes, hours or days)      This am... Has experienced these episodes last 3 days. 3. DURATION: "How long does it last" (e.g., seconds, minutes, hours)     "30 minutes or so." 4. PATTERN "Does it come and go, or has it been constant since it started?"  "Does it get worse with exertion?"   "Are you feeling it now?"    "Comes and goes" Presently with episode at rest. 5. TAP: "Using your hand, can you tap out what you are feeling on a chair or table in front of you, so that I can hear?" (Note: not all patients can do this)       Pt states irregular. 6. HEART RATE: "Can you tell me your heart rate?" "How many beats in 15 seconds?"  (Note: not all patients can do this)       130 7. RECURRENT SYMPTOM: "Have you ever had this before?" If so, ask: "When was the last time?" and "What happened that time?"      YEs with anxiety. "Don't feel anxious at all right now. Just woke up with  this." 8. CAUSE: "What do you think is causing the palpitations?"     "Not sure." H/O anxiety. "I'm not anxious right now." Has not had any caffeine or change in medications. No new stressors. 9. CARDIAC HISTORY: "Do you have any history of heart disease?" (e.g., heart attack, angina, bypass surgery, angioplasty, arrhythmia)      no 10. OTHER SYMPTOMS: "Do you have any other symptoms?" (e.g., dizziness, chest pain, sweating, difficulty breathing)       Left sided chest "burning and tingling sensation, goes down my left arm." States left arm is numb. "Some shortness of breath."  Protocols used: HEART RATE AND HEARTBEAT QUESTIONS-A-AH

## 2017-02-28 NOTE — Telephone Encounter (Signed)
Monitor for ER arrival 

## 2017-02-28 NOTE — ED Provider Notes (Signed)
[facility] Provider Note   CSN: [id number] Arrival date & time: 02/28/17  [id number]     History   Chief Complaint Chief Complaint  Patient presents with  . Palpitations    HPI Raymond Newman is a 32 y.o. male with a past medical history of anxiety, hyperlipidemia, rosacea who presents the emergency department today for palpitations.  Patient has [facility] for same symptoms [facility] and by his [facility]CP.  Symptoms initially began close to his wedding time he thought was related to this.  Intermittent bouts of "panic attacks".  He has been managing this with CBD oil.  He recently started Celexa 3 days ago by his PCP.  Over the last 4 days he has been havingepisodes of new symptoms including left-sided chest burning, and racing of his heart.  He he has had 4 episodes of this.  They have most often occurred at work. He notes that there is some associated nausea with this but no emesis, diaphoresis, chest pain or pressure.  Patient denies any alcohol, drug use, caffeine use.  Patient is not taking any over-the-counter medications.  Patient is a never smoker.  No family history of early CAD. Denies risk factors for DVT/PE including exogenous testosterone use, recent surgery or travel, trauma, immobilization, smoking, previous blood clot, cough, hemoptysis, cancer, lower extremity pain or swelling, or family history of bleeding/clotting disorder.   HPI  Past Medical History:  Diagnosis Date  . Anxiety    with flying  . Hyperlipidemia   . Rosacea   . Seasonal allergies     There are no active problems to display for this patient.   History reviewed. No pertinent surgical history.     Home Medications    Prior to Admission medications   Medication Sig Start Date End Date Taking? Authorizing Provider  citalopram (CELEXA) 10 MG tablet Take 1 tablet (10 mg total) by mouth daily. Patient not taking: Reported on 02/22/2017 01/17/17    Shirline FreesNafziger, Cory, NP  metroNIDAZOLE (METROGEL) 0.75 % gel APPLY TO THE AFFECTED SKIN ON THE FACE TWICE DAILY. 02/17/15   [provider]  Multiple Vitamin (ONE-A-DAY MENS PO) Take 1 tablet by mouth daily.    [provider]    Family History Family History  Problem Relation Age of Onset  . Hypertension Father   . Hyperlipidemia Father   . Melanoma Mother   . Lupus Mother   . Hypercholesterolemia Paternal Grandfather     Social History Social History   Tobacco Use  . Smoking status: Never Smoker  . Smokeless tobacco: Never Used  Substance Use Topics  . Alcohol use: Yes    Alcohol/week: 0.0 oz    Comment: beer daily   . Drug use: No     Allergies   Patient has no known allergies.   Review of Systems Review of Systems  All other systems reviewed and are negative.    Physical Exam Updated Vital Signs BP 118/81   Pulse 72   Temp 98.2 F (36.8 C) (Oral)   Resp 13   SpO2 98%   Physical Exam  Constitutional: He appears well-developed and well-nourished.  HENT:  Head: Normocephalic and atraumatic.  Right Ear: External ear normal.  Left Ear: External ear normal.  Nose: Nose normal.  Mouth/Throat: Uvula is midline, oropharynx is clear and moist and mucous membranes are normal. No tonsillar exudate.  Eyes: Pupils are equal, round, and reactive to light. Right eye  exhibits no discharge. Left eye exhibits no discharge. No scleral icterus.  No exophthalmos  Neck: Trachea normal. Neck supple. No spinous process tenderness present. No neck rigidity. Normal range of motion present.  Cardiovascular: Normal rate, regular rhythm and intact distal pulses.  No murmur heard. Pulses:      Radial pulses are 2+ on the right side, and 2+ on the left side.       Dorsalis pedis pulses are 2+ on the right side, and 2+ on the left side.       Posterior tibial pulses are 2+ on the right side, and 2+ on the left side.  No lower extremity swelling or edema. Calves  symmetric in size bilaterally.  Pulmonary/Chest: Effort normal and breath sounds normal. He exhibits no tenderness.  No increased work of breathing. No accessory muscle use. Patient is sitting upright, speaking in full sentences without difficulty   Abdominal: Soft. Bowel sounds are normal. There is no tenderness. There is no rigidity, no rebound, no guarding and no CVA tenderness.  Musculoskeletal: He exhibits no edema.  Lymphadenopathy:    He has no cervical adenopathy.  Neurological: He is alert.  Mental Status:  Alert, oriented, thought content appropriate, able to give a coherent history. Speech fluent without evidence of aphasia. Able to follow 2 step commands without difficulty.  Cranial Nerves:  II:  Peripheral visual fields grossly normal, pupils equal, round, reactive to light III,IV, VI: ptosis not present, extra-ocular motions intact bilaterally  V,VII: smile symmetric, eyebrows raise symmetric, facial light touch sensation equal VIII: hearing grossly normal to voice  X: uvula elevates symmetrically  XI: bilateral shoulder shrug symmetric and strong XII: midline tongue extension without fassiculations Motor:  Normal tone. 5/5 in upper and lower extremities bilaterally including strong and equal grip strength and dorsiflexion/plantar flexion Sensory: Sensation intact to light touch in all extremities. Negative Romberg.  Deep Tendon Reflexes: 2+ and symmetric in the biceps and patella Cerebellar: normal finger-to-nose with bilateral upper extremities. Normal heel-to -shin balance bilaterally of the lower extremity. No pronator drift.  Gait: normal gait and balance CV: distal pulses palpable throughout   Skin: Skin is warm and dry. Capillary refill takes less than 2 seconds. No rash noted. He is not diaphoretic.  Psychiatric: He has a normal mood and affect.  Nursing note and vitals reviewed.    ED Treatments / Results  Labs (all labs ordered are listed, but only abnormal  results are displayed) Labs Reviewed  BASIC METABOLIC PANEL - Abnormal; Notable for the following components:      Result Value   CO2 21 (*)    Glucose, Bld 117 (*)    All other components within normal limits  CBC - Abnormal; Notable for the following components:   Hemoglobin 17.3 (*)    All other components within normal limits  I-STAT TROPONIN, ED    EKG  EKG Interpretation  Date/Time:  Monday February 28 2017 08:39:26 EST Ventricular Rate:  135 PR Interval:  134 QRS Duration: 82 QT Interval:  280 QTC Calculation: 420 R Axis:   110 Text Interpretation:  Sinus tachycardia Right atrial enlargement Right axis deviation Pulmonary disease pattern Nonspecific ST abnormality Abnormal ECG Confirmed by Kristine Royal [id number]) on 02/28/2017 8:58:41 AM       Radiology Dg Chest 2 View  Result Date: 02/28/2017 CLINICAL DATA:  Palpitations. EXAM: CHEST  2 VIEW COMPARISON:  None. FINDINGS: The heart size and mediastinal contours are within normal limits. Both lungs are clear. No  pneumothorax or pleural effusion is noted. The visualized skeletal structures are unremarkable. IMPRESSION: No active cardiopulmonary disease. Electronically Signed   By: Lupita Raider, M.D.   On: 02/28/2017 10:08    Procedures Procedures (including critical care time)  Medications Ordered in ED Medications  LORazepam (ATIVAN) tablet 1 mg (1 mg Oral Given 02/28/17 1348)     Initial Impression / Assessment and Plan / ED Course  I have reviewed the triage vital signs and the nursing notes.  Pertinent labs & imaging results that were available during my care of the patient were reviewed by me and considered in my medical decision making (see chart for details).     32 year old male presenting with palpitations and burning of the chest over the last 4 days. Previous history of anxiety and panic attacks. Recently started on Celexa 3 days ago for this. Patient has been seen at [facility] for this in the past with a  benign workup besides elevated TSH of 3.618 but normal free T4 and free T3.  Do not feel the patient needs additional workup for thyroid at this time.  Do not suspect the patient is in thyrotoxicosis.  On presentation to the emergency department the patient was tachycardic at 130.  EKG at that time showed sinus tachycardia.  This is resolved the patient's heart rate has remained under 100 all the department. Repeat EKG sinus rhythm. He is currently without any symptoms at this time.  He is neurologically intact.  Chest x-ray did not show any evidence of acute cardiopulmonary disease.  No pneumothorax or pleural effusion.  Labs are reassuring.  Troponin 0.01.  No electrolyte abnormalities.  Kidney function within normal limits.  No leukocytosis.  No anemia.  The patient is resting comfortably, in no apparent distress and asymptomatic.  Patient was given 1 mg of Ativan in the department and states he feels relief with this.  Will discharge with a short course of Ativan as needed with close follow-up with PCP in the next 72 hours.  He is to continue taking Celexa.  Do not believe this is causing patient's symptoms at this time.  He has no risk factors for PE and low cardiac risk based on HEART score. Specific return precautions discussed. Time was given for all questions to be answered. The patient verbalized understanding and agreement with plan. The patient appears safe for discharge home.  Final Clinical Impressions(s) / ED Diagnoses   Final diagnoses:  Palpitations    ED Discharge Orders        Ordered    LORazepam (ATIVAN) 1 MG tablet  Daily PRN     02/28/17 1425       Jacinto Halim, PA-C 02/28/17 1426    Jacinto Halim, PA-C 02/28/17 1452    Bethann Berkshire, MD 03/05/17 1154

## 2017-02-28 NOTE — ED Triage Notes (Signed)
Patient reports intermittent palpitations the past few days. This am the palpitations didn't stop, no CP. Reports that he has anxiety. alert and orienteed

## 2017-02-28 NOTE — Telephone Encounter (Signed)
Confirmed patient has arrived in the ER now. 

## 2017-02-28 NOTE — Discharge Instructions (Signed)
Please follow attached handouts.  Please take Celexa as prescribed.  Take Ativan as needed for further attacks.  If you develop worsening or new concerning symptoms you can return to the emergency department for re-evaluation.

## 2017-03-03 ENCOUNTER — Ambulatory Visit: Payer: 59 | Admitting: Adult Health

## 2017-03-03 ENCOUNTER — Encounter: Payer: Self-pay | Admitting: Adult Health

## 2017-03-03 ENCOUNTER — Encounter: Payer: Self-pay | Admitting: Gastroenterology

## 2017-03-03 VITALS — BP 120/72 | HR 77 | Temp 97.7°F | Ht 69.0 in | Wt 174.2 lb

## 2017-03-03 DIAGNOSIS — K2100 Gastro-esophageal reflux disease with esophagitis, without bleeding: Secondary | ICD-10-CM

## 2017-03-03 DIAGNOSIS — F419 Anxiety disorder, unspecified: Secondary | ICD-10-CM | POA: Diagnosis not present

## 2017-03-03 DIAGNOSIS — R000 Tachycardia, unspecified: Secondary | ICD-10-CM

## 2017-03-03 NOTE — Progress Notes (Signed)
Subjective:    Patient ID: Raymond Newman, male    DOB: [birth year], 32 y.o.   MRN: [id number]  HPI   32 year old male who  has a past medical history of Anxiety, Hyperlipidemia, Rosacea, and Seasonal allergies. He presents to the clinic today after being seen in the ER three days ago for palpitations.   Per ER note:  He recently started Celexa 3 days ago by his PCP.  Over the last 4 days he has been having episodes of new symptoms including left-sided chest burning, and racing of his heart.  He he has had 4 episodes of this.  They have most often occurred at work. He notes that there is some associated nausea with this but no emesis, diaphoresis, chest pain or pressure.  Patient denies any alcohol, drug use, caffeine use.  Patient is not taking any over-the-counter medications.  Patient is a never smoker.  No family history of early CAD. Denies risk factors for DVT/PE including exogenous testosterone use, recent surgery or travel, trauma, immobilization, smoking, previous blood clot, cough, hemoptysis, cancer, lower extremity pain or swelling, or family history of bleeding/clotting disorder.   On presentation to the ER the patient was tachycardia at 130. EKG at that time showed sinus tachycardia. This resolved and his heart rate remained under 100 during the rest of his stay. Chest x ray was negative and did not show any acute cardiopulmonary disease. Labs were reassuring, troponin was negative. He was given 1 mg ativan in the department and had relief with this. He was discharged on a short course of ativan as needed. He was advised to continue to take Celexa as it was thought not to be the cause of his symptoms.   Today in the office he reports that since being seen in the hospital he has not had any panic attacks. He has been taking the Celexa as directed for the last five days.   He does reports that he feels as though his GERD symptoms have been becoming worse. He is often waking up in the  night with a sour taste in his mouth and a burning sensation in his chest. He has been on OTC Prilosec in the past and has recently restarted this. He would like to be seen by GI to make sure there is nothing else going on.   Review of Systems See HPI   Past Medical History:  Diagnosis Date  . Anxiety    with flying  . Hyperlipidemia   . Rosacea   . Seasonal allergies     Social History   Socioeconomic History  . Marital status: Single    Spouse name: Not on file  . Number of children: Not on file  . Years of education: Not on file  . Highest education level: Not on file  Social Needs  . Financial resource strain: Not on file  . Food insecurity - worry: Not on file  . Food insecurity - inability: Not on file  . Transportation needs - medical: Not on file  . Transportation needs - non-medical: Not on file  Occupational History  . Not on file  Tobacco Use  . Smoking status: Never Smoker  . Smokeless tobacco: Never Used  Substance and Sexual Activity  . Alcohol use: Yes    Alcohol/week: 0.0 oz    Comment: beer daily   . Drug use: No  . Sexual activity: Not on file  Other Topics Concern  . Not on file  Social  History Narrative   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    Has a girlfriend    Just bought a house in December        No past surgical history on file.  Family History  Problem Relation Age of Onset  . Hypertension Father   . Hyperlipidemia Father   . Melanoma Mother   . Lupus Mother   . Hypercholesterolemia Paternal Grandfather     No Known Allergies  Current Outpatient Medications on File Prior to Visit  Medication Sig Dispense Refill  . citalopram (CELEXA) 10 MG tablet Take 1 tablet (10 mg total) by mouth daily. 30 tablet 3  . metroNIDAZOLE (METROGEL) 0.75 % gel APPLY TO THE AFFECTED SKIN ON THE FACE TWICE DAILY.  0  . Multiple Vitamin (ONE-A-DAY MENS PO) Take 1 tablet by mouth daily.     No current facility-administered medications on file  prior to visit.     BP 120/72 (BP Location: Right Arm, Patient Position: Sitting, Cuff Size: Normal)   Pulse 77   Temp 97.7 F (36.5 C) (Oral)   Ht 5\' 9"  (1.753 m)   Wt 174 lb 3.2 oz (79 kg)   SpO2 98%   BMI 25.72 kg/m       Objective:   Physical Exam  Constitutional: He is oriented to person, place, and time. He appears well-developed and well-nourished. No distress.  Cardiovascular: Normal rate, regular rhythm, normal heart sounds and intact distal pulses. Exam reveals no gallop and no friction rub.  No murmur heard. Pulmonary/Chest: Effort normal and breath sounds normal. No respiratory distress. He has no wheezes. He has no rales. He exhibits no tenderness.  Abdominal: Soft. Bowel sounds are normal. He exhibits no distension and no mass. There is tenderness in the epigastric area. There is no rebound and no guarding.  Neurological: He is alert and oriented to person, place, and time.  Skin: Skin is warm and dry. No rash noted. He is not diaphoretic. No erythema. No pallor.  Psychiatric: He has a normal mood and affect. His behavior is normal. Thought content normal.  Nursing note and vitals reviewed.      Assessment & Plan:  1. Anxiety - continue with celexa  - Follow up as needed  2. Tachycardia  - Holter monitor - 48 hour; Future  3. Gastroesophageal reflux disease with esophagitis - Advised to restart OTC prilosec.  - His GERD like symptoms may be exacerbating his anxiety and panic attacks.  Do not necessarily think he needs to see GI at this time but he is addiment about seeing them   - Ambulatory referral to Gastroenterology   Shirline Freesory Heaven Wandell, NP

## 2017-03-15 ENCOUNTER — Ambulatory Visit (INDEPENDENT_AMBULATORY_CARE_PROVIDER_SITE_OTHER): Payer: 59

## 2017-03-15 DIAGNOSIS — R000 Tachycardia, unspecified: Secondary | ICD-10-CM

## 2017-03-24 ENCOUNTER — Telehealth: Payer: Self-pay | Admitting: Adult Health

## 2017-03-24 NOTE — Telephone Encounter (Signed)
Copied from CRM [id number]. Topic: Quick Communication - Lab Results >> Mar 24, 2017  2:30 PM Cecelia ByarsGreen, Khiem Gargis L, RMA wrote: Patient is requesting results from Holter monitor, pease return call

## 2017-03-25 NOTE — Telephone Encounter (Signed)
Spoke to patient informed him of his results from hie  Holter monitor. He has been taking Celexa for close to a month now he is starting to feel less anxious and has his energy level back. Has not had any panic attacks in a week.

## 2017-03-25 NOTE — Telephone Encounter (Signed)
Copied from CRM [phone]. Topic: Quick Communication - Lab Results >> Mar 25, 2017  8:20 AM Louie BunPalacios Medina, Rosey Batheresa D wrote: Patient was returning call about his holter monitor results. Please call patient back, thanks.

## 2017-03-31 ENCOUNTER — Encounter: Payer: Self-pay | Admitting: Gastroenterology

## 2017-03-31 ENCOUNTER — Ambulatory Visit: Payer: 59 | Admitting: Gastroenterology

## 2017-03-31 VITALS — BP 140/70 | HR 80 | Ht 67.25 in | Wt 168.1 lb

## 2017-03-31 DIAGNOSIS — R079 Chest pain, unspecified: Secondary | ICD-10-CM | POA: Diagnosis not present

## 2017-03-31 DIAGNOSIS — K219 Gastro-esophageal reflux disease without esophagitis: Secondary | ICD-10-CM | POA: Diagnosis not present

## 2017-03-31 DIAGNOSIS — R1013 Epigastric pain: Secondary | ICD-10-CM | POA: Diagnosis not present

## 2017-03-31 NOTE — Patient Instructions (Signed)
You have been scheduled for an endoscopy. Please follow written instructions given to you at your visit today. If you use inhalers (even only as needed), please bring them with you on the day of your procedure. Your physician has requested that you go to www.startemmi.com and enter the access code given to you at your visit today. This web site gives a general overview about your procedure. However, you should still follow specific instructions given to you by our office regarding your preparation for the procedure.  Patient advised to avoid spicy, acidic, citrus, chocolate, mints, fruit and fruit juices.  Limit the intake of caffeine, alcohol and Soda.  Don't exercise too soon after eating.  Don't lie down within 3-4 hours of eating.  Elevate the head of your bed.  Thank you for choosing me and [facility].  Malcolm T. Stark, Jr., MD., FACG  

## 2017-03-31 NOTE — Progress Notes (Signed)
History of Present Illness: This is a 32 year old male referred by Shirline Frees, NP for the evaluation of GERD, chest pain, epigastric pain and diarrhea.  He relates a history of anxiety with palpations and chest pain.  He was started on Celexa about 1 month ago.  He previously had problems with frequent loose stools and diarrhea however since he discontinued alcohol and coffee intake he now has one well-formed bowel movement per day.  He also has noted less problems with palpitations and episodic chest pain.  He relates epigastric and substernal pain that is worsened with spicy foods.  Prilosec has been partially effective in controlling these symptoms.  He has chest pain and epigastric pain at other times that is not related to meals. Denies weight loss, constipation, change in stool caliber, melena, hematochezia, nausea, vomiting, dysphagia.   No Known Allergies Outpatient Medications Prior to Visit  Medication Sig Dispense Refill  . citalopram (CELEXA) 10 MG tablet Take 1 tablet (10 mg total) by mouth daily. 30 tablet 3  . metroNIDAZOLE (METROGEL) 0.75 % gel APPLY TO THE AFFECTED SKIN ON THE FACE TWICE DAILY.  0  . Multiple Vitamin (ONE-A-DAY MENS PO) Take 1 tablet by mouth daily.    Marland Kitchen omeprazole (PRILOSEC) 10 MG capsule Take 10 mg by mouth every morning.     No facility-administered medications prior to visit.    Past Medical History:  Diagnosis Date  . Anxiety    with flying  . Hyperlipidemia   . Rosacea   . Seasonal allergies    History reviewed. No pertinent surgical history. Social History   Socioeconomic History  . Marital status: Married    Spouse name: Irving Burton  . Number of children: 0  . Years of education: None  . Highest education level: None  Social Needs  . Financial resource strain: None  . Food insecurity - worry: None  . Food insecurity - inability: None  . Transportation needs - medical: None  . Transportation needs - non-medical: None  Occupational  History  . Occupation: Chef  Tobacco Use  . Smoking status: Never Smoker  . Smokeless tobacco: Never Used  Substance and Sexual Activity  . Alcohol use: Yes    Alcohol/week: 0.0 oz    Comment: beer daily   . Drug use: No  . Sexual activity: Yes    Partners: Female  Other Topics Concern  . None  Social History Narrative   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[employer]    Has a girlfriend    Just bought a house in December       Family History  Problem Relation Age of Onset  . Hypertension Father   . Hyperlipidemia Father   . Melanoma Mother   . Lupus Mother   . Hypercholesterolemia Paternal Grandfather       Review of Systems: Pertinent positive and negative review of systems were noted in the above HPI section. All other review of systems were otherwise negative.    Physical Exam: General: Well developed, well nourished, no acute distress Head: Normocephalic and atraumatic Eyes:  sclerae anicteric, EOMI Ears: Normal auditory acuity Mouth: No deformity or lesions Neck: Supple, no masses or thyromegaly Lungs: Clear throughout to auscultation Heart: Regular rate and rhythm; no murmurs, rubs or bruits Abdomen: Soft, non tender and non distended. No masses, hepatosplenomegaly or hernias noted. Normal Bowel sounds Rectal: not done Musculoskeletal: Symmetrical with no gross deformities  Skin: No lesions on visible extremities Pulses:  Normal  pulses noted Extremities: No clubbing, cyanosis, edema or deformities noted Neurological: Alert oriented x 4, grossly nonfocal Cervical Nodes:  No significant cervical adenopathy Inguinal Nodes: No significant inguinal adenopathy Psychological:  Alert and cooperative. Normal mood and affect  Assessment and Recommendations:    1. GERD is causing a component of his symptoms.  Omeprazole 20 mg daily.  Closely follow antireflux measures.  Rule out esophagitis, ulcer, etc.  Schedule EGD. The risks (including bleeding, perforation, infection,  missed lesions, medication reactions and possible hospitalization or surgery if complications occur), benefits, and alternatives to endoscopy with possible biopsy and possible dilation were discussed with the patient and they consent to proceed.     cc: Shirline FreesNafziger, Cory, NP [address] [place], [place] [id number]

## 2017-04-06 ENCOUNTER — Ambulatory Visit: Payer: Self-pay | Admitting: *Deleted

## 2017-04-06 NOTE — Telephone Encounter (Signed)
Pt states started Celexa Jan. 2019 as prescribed. States has been feeling "Jittery and amped up since." Hands "shakey at times." Symptoms worse in morning after taking medication. Reports heart "beats harder at times."  B/P "at dentist" yesterday 139/90.  Denies palpitations, SOB. States mild dizziness at times. Pt states all these symptoms are mild and feels anxiety level overall is better. Pt questioning if dose should be reduced. He has an appt 04/20/17 with C. Nafziger. Please advise:  # [phone]  Reason for Disposition . Caller has NON-URGENT medication question about med that PCP prescribed and triager unable to answer question  Answer Assessment - Initial Assessment Questions 1. SYMPTOMS: "Do you have any symptoms?"     "Jittery, amped up."   2. SEVERITY: If symptoms are present, ask "Are they mild, moderate or severe?"     Mild  Protocols used: MEDICATION QUESTION CALL-A-AH

## 2017-04-06 NOTE — Telephone Encounter (Signed)
He can cut the pill in half.

## 2017-04-06 NOTE — Telephone Encounter (Signed)
Spoke to the pt and informed him to take 1/2 tab of Celexa until seen on the 20th.  Call back if needed.

## 2017-04-11 ENCOUNTER — Ambulatory Visit: Payer: 59 | Admitting: Gastroenterology

## 2017-04-11 ENCOUNTER — Encounter: Payer: Self-pay | Admitting: Gastroenterology

## 2017-04-20 ENCOUNTER — Encounter: Payer: 59 | Admitting: Adult Health

## 2017-04-20 ENCOUNTER — Encounter: Payer: Self-pay | Admitting: Adult Health

## 2017-04-20 ENCOUNTER — Ambulatory Visit (INDEPENDENT_AMBULATORY_CARE_PROVIDER_SITE_OTHER): Payer: 59 | Admitting: Adult Health

## 2017-04-20 ENCOUNTER — Other Ambulatory Visit (HOSPITAL_COMMUNITY)
Admission: RE | Admit: 2017-04-20 | Discharge: 2017-04-20 | Disposition: A | Discharge status: Home / Self Care | Payer: 59 | Source: Ambulatory Visit | Attending: Adult Health | Admitting: Adult Health

## 2017-04-20 VITALS — BP 138/80 | Temp 98.3°F | Ht 69.0 in | Wt 167.0 lb

## 2017-04-20 DIAGNOSIS — K2100 Gastro-esophageal reflux disease with esophagitis, without bleeding: Secondary | ICD-10-CM

## 2017-04-20 DIAGNOSIS — F419 Anxiety disorder, unspecified: Secondary | ICD-10-CM

## 2017-04-20 DIAGNOSIS — Z113 Encounter for screening for infections with a predominantly sexual mode of transmission: Secondary | ICD-10-CM | POA: Diagnosis not present

## 2017-04-20 DIAGNOSIS — Z0000 Encounter for general adult medical examination without abnormal findings: Secondary | ICD-10-CM | POA: Diagnosis not present

## 2017-04-20 LAB — CBC WITH DIFFERENTIAL/PLATELET
Basophils Absolute: 0.1 10*3/uL (ref 0.0–0.1)
Basophils Relative: 0.9 % (ref 0.0–3.0)
Eosinophils Absolute: 0.1 10*3/uL (ref 0.0–0.7)
Eosinophils Relative: 1.2 % (ref 0.0–5.0)
HCT: 45 % (ref 39.0–52.0)
Hemoglobin: 15.6 g/dL (ref 13.0–17.0)
Lymphocytes Relative: 19.5 % (ref 12.0–46.0)
Lymphs Abs: 1.2 10*3/uL (ref 0.7–4.0)
MCHC: 34.7 g/dL (ref 30.0–36.0)
MCV: 88.9 fl (ref 78.0–100.0)
Monocytes Absolute: 0.6 10*3/uL (ref 0.1–1.0)
Monocytes Relative: 10.4 % (ref 3.0–12.0)
Neutro Abs: 4.2 10*3/uL (ref 1.4–7.7)
Neutrophils Relative %: 68 % (ref 43.0–77.0)
Platelets: 227 10*3/uL (ref 150.0–400.0)
RBC: 5.06 Mil/uL (ref 4.22–5.81)
RDW: 12.4 % (ref 11.5–15.5)
WBC: 6.2 10*3/uL (ref 4.0–10.5)

## 2017-04-20 LAB — COMPREHENSIVE METABOLIC PANEL
ALT: 16 U/L (ref 0–53)
AST: 14 U/L (ref 0–37)
Albumin: 4.8 g/dL (ref 3.5–5.2)
Alkaline Phosphatase: 51 U/L (ref 39–117)
BUN: 11 mg/dL (ref 6–23)
CO2: 26 mEq/L (ref 19–32)
Calcium: 10.5 mg/dL (ref 8.4–10.5)
Chloride: 106 mEq/L (ref 96–112)
Creatinine, Ser: 0.92 mg/dL (ref 0.40–1.50)
GFR: 100.96 mL/min (ref >60.00)
Glucose, Bld: 96 mg/dL (ref 70–99)
Potassium: 3.9 mEq/L (ref 3.5–5.1)
Sodium: 144 mEq/L (ref 135–145)
Total Bilirubin: 1 mg/dL (ref 0.2–1.2)
Total Protein: 6.3 g/dL (ref 6.0–8.3)

## 2017-04-20 LAB — LIPID PANEL
Cholesterol: 176 mg/dL (ref 0–200)
HDL: 47.7 mg/dL (ref >39.00)
LDL Cholesterol: 113 mg/dL — ABNORMAL HIGH (ref 0–99)
NonHDL: 128.64
Total CHOL/HDL Ratio: 4
Triglycerides: 78 mg/dL (ref 0.0–149.0)
VLDL: 15.6 mg/dL (ref 0.0–40.0)

## 2017-04-20 LAB — TSH: TSH: 1.07 u[IU]/mL (ref 0.35–4.50)

## 2017-04-20 NOTE — Progress Notes (Addendum)
Subjective:    Patient ID: Raymond Newman, male    DOB: Mar 27, 1985, 32 y.o.   MRN: [id number]  HPI Patient presents for yearly preventative medicine examination. He is a pleasant 32 year old male who  has a past medical history of Anxiety, Hyperlipidemia, Rosacea, and Seasonal allergies.  He was recently started on Celexa 10 mg for anxiety related symptoms.  Over the last month he reported that his anxiety has improved but recently started to experience "amped up and jittery feeling".  At this time I had him take one half tab (5mg ) of Celexa.  Today in the office he reports that since decreasing the dose he feels well controlled.   Is taking omeprazole 10 mg for GERD-like symptoms.  Has been seen by GI who recommended EGD - this will be done in 5 days.   All immunizations and health maintenance protocols were reviewed with the patient and needed orders were placed.  Appropriate screening laboratory values were ordered for the patient including screening of hyperlipidemia, renal function and hepatic function.  Medication reconciliation,  past medical history, social history, problem list and allergies were reviewed in detail with the patient  Goals were established with regard to weight loss, exercise, and  diet in compliance with medications. He is active at work and has recently started weight training at home. Eating healthy and is no longer drinking beer or caffeine   Review of Systems  Constitutional: Negative.   HENT: Negative.   Eyes: Negative.   Respiratory: Negative.   Cardiovascular: Negative.   Gastrointestinal: Negative.   Endocrine: Negative.   Genitourinary: Negative.   Musculoskeletal: Negative.   Skin: Negative.   Allergic/Immunologic: Negative.   Neurological: Negative.   Hematological: Negative.   Psychiatric/Behavioral: The patient is nervous/anxious.   All other systems reviewed and are negative.  Past Medical History:  Diagnosis Date  . Anxiety    with  flying  . Hyperlipidemia   . Rosacea   . Seasonal allergies     Social History   Socioeconomic History  . Marital status: Married    Spouse name: Irving Burtonmily  . Number of children: 0  . Years of education: Not on file  . Highest education level: Not on file  Social Needs  . Financial resource strain: Not on file  . Food insecurity - worry: Not on file  . Food insecurity - inability: Not on file  . Transportation needs - medical: Not on file  . Transportation needs - non-medical: Not on file  Occupational History  . Occupation: Chef  Tobacco Use  . Smoking status: Never Smoker  . Smokeless tobacco: Never Used  Substance and Sexual Activity  . Alcohol use: Yes    Alcohol/week: 0.0 oz    Comment: beer daily   . Drug use: No  . Sexual activity: Yes    Partners: Female  Other Topics Concern  . Not on file  Social History Narrative   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    Has a girlfriend    Just bought a house in December        History reviewed. No pertinent surgical history.  Family History  Problem Relation Age of Onset  . Hypertension Father   . Hyperlipidemia Father   . Melanoma Mother   . Lupus Mother   . Hypercholesterolemia Paternal Grandfather     No Known Allergies  Current Outpatient Medications on File Prior to Visit  Medication Sig Dispense Refill  . citalopram (CELEXA)  10 MG tablet Take 1 tablet (10 mg total) by mouth daily. (Patient taking differently: Take 5 mg by mouth daily. ) 30 tablet 3  . Flaxseed, Linseed, (FLAXSEED OIL PO) Take by mouth.    . metroNIDAZOLE (METROGEL) 0.75 % gel APPLY TO THE AFFECTED SKIN ON THE FACE TWICE DAILY.  0  . Multiple Vitamin (ONE-A-DAY MENS PO) Take 1 tablet by mouth daily.    . NON FORMULARY CBD OIL DAILY    . Omega-3 Fatty Acids (FISH OIL) 1000 MG CAPS Take by mouth.    Marland Kitchen omeprazole (PRILOSEC) 10 MG capsule Take 10 mg by mouth every morning.     No current facility-administered medications on file prior to  visit.     BP 138/80 (BP Location: Left Arm)   Temp 98.3 F (36.8 C) (Oral)   Ht 5\' 9"  (1.753 m) Comment: With Shoes  Wt 167 lb (75.8 kg)   BMI 24.66 kg/m      Objective:   Physical Exam  Constitutional: He is oriented to person, place, and time. He appears well-developed and well-nourished. No distress.  HENT:  Head: Normocephalic and atraumatic.  Right Ear: External ear normal.  Left Ear: External ear normal.  Mouth/Throat: Oropharynx is clear and moist. No oropharyngeal exudate.  Eyes: Conjunctivae and EOM are normal. Pupils are equal, round, and reactive to light. Right eye exhibits no discharge. Left eye exhibits no discharge. No scleral icterus.  Neck: Normal range of motion. Neck supple. No JVD present. No tracheal deviation present.  Cardiovascular: Normal rate, normal heart sounds and intact distal pulses. Exam reveals no gallop and no friction rub.  No murmur heard. Pulmonary/Chest: Effort normal and breath sounds normal. No stridor. No respiratory distress. He has no wheezes. He has no rales. He exhibits no tenderness.  Abdominal: Soft. Bowel sounds are normal. He exhibits no distension and no mass. There is no tenderness. There is no rebound and no guarding.  Musculoskeletal: Normal range of motion. He exhibits no edema, tenderness or deformity.  Lymphadenopathy:    He has no cervical adenopathy.  Neurological: He is alert and oriented to person, place, and time. He has normal reflexes. He displays normal reflexes. No cranial nerve deficit. He exhibits normal muscle tone. Coordination normal.  Skin: Skin is warm and dry. No rash noted. He is not diaphoretic. No erythema. No pallor.  Psychiatric: He has a normal mood and affect. His behavior is normal. Judgment and thought content normal.  Nursing note and vitals reviewed.     Assessment & Plan:  1. Routine general medical examination at a health care facility - Continue heart healthy diet and exercise  - Follow up in  one year or sooner if needed - Comprehensive metabolic panel - Lipid panel - TSH - CBC with Differential/Platelet  2. Anxiety - Continue with Celexa 5 mg   3. Gastroesophageal reflux disease with esophagitis - POC by GI   4. Screen for STD (sexually transmitted disease)  - HIV antibody - Urine cytology ancillary only   Shirline Frees, NP

## 2017-04-20 NOTE — Patient Instructions (Signed)
It was great seeing you today   I will follow up with you regarding your lab work

## 2017-04-21 LAB — HIV ANTIBODY (ROUTINE TESTING W REFLEX): HIV 1&2 Ab, 4th Generation: NONREACTIVE

## 2017-04-22 LAB — URINE CYTOLOGY ANCILLARY ONLY
Chlamydia: NEGATIVE
Neisseria Gonorrhea: NEGATIVE
Trichomonas: NEGATIVE

## 2017-04-25 ENCOUNTER — Ambulatory Visit (AMBULATORY_SURGERY_CENTER): Payer: 59 | Admitting: Gastroenterology

## 2017-04-25 ENCOUNTER — Encounter: Payer: Self-pay | Admitting: Gastroenterology

## 2017-04-25 ENCOUNTER — Other Ambulatory Visit: Payer: Self-pay

## 2017-04-25 VITALS — BP 132/63 | HR 73 | Temp 98.6°F | Resp 20 | Ht 67.0 in | Wt 168.0 lb

## 2017-04-25 DIAGNOSIS — K319 Disease of stomach and duodenum, unspecified: Secondary | ICD-10-CM

## 2017-04-25 DIAGNOSIS — R079 Chest pain, unspecified: Secondary | ICD-10-CM | POA: Diagnosis not present

## 2017-04-25 DIAGNOSIS — K3189 Other diseases of stomach and duodenum: Secondary | ICD-10-CM | POA: Diagnosis not present

## 2017-04-25 DIAGNOSIS — K219 Gastro-esophageal reflux disease without esophagitis: Secondary | ICD-10-CM | POA: Diagnosis not present

## 2017-04-25 DIAGNOSIS — R1013 Epigastric pain: Secondary | ICD-10-CM

## 2017-04-25 MED ORDER — SODIUM CHLORIDE 0.9 % IV SOLN: 500.0000 mL | Freq: Once | INTRAVENOUS | Status: DC

## 2017-04-25 NOTE — Progress Notes (Signed)
Called to room to assist during endoscopic procedure.  Patient ID and intended procedure confirmed with present staff. Received instructions for my participation in the procedure from the performing physician.  

## 2017-04-25 NOTE — Progress Notes (Signed)
Patient reports pulse being irregular for the past couple days. Pt denies any cardiac history. Patient appears very nervous, and states he has a history of anxiety and panic attacks. MD aware and gave order for patient to be put on monitor prior to procedure. EKG showed tachycardia with PVC's present. CRNA spoke with patient regarding irregular pulse. CRNA and MD will proceed with upper endoscopy as scheduled.

## 2017-04-25 NOTE — Patient Instructions (Signed)
YOU HAD AN ENDOSCOPIC PROCEDURE TODAY AT THE [facility]:   Refer to the procedure report that was given to you for any specific questions about what was found during the examination.  If the procedure report does not answer your questions, please call your gastroenterologist to clarify.  If you requested that your care partner not be given the details of your procedure findings, then the procedure report has been included in a sealed envelope for you to review at your convenience later.  YOU SHOULD EXPECT: Some feelings of bloating in the abdomen. Passage of more gas than usual.  Walking can help get rid of the air that was put into your GI tract during the procedure and reduce the bloating. If you had a lower endoscopy (such as a colonoscopy or flexible sigmoidoscopy) you may notice spotting of blood in your stool or on the toilet paper. If you underwent a bowel prep for your procedure, you may not have a normal bowel movement for a few days.  Please Note:  You might notice some irritation and congestion in your nose or some drainage.  This is from the oxygen used during your procedure.  There is no need for concern and it should clear up in a day or so.  SYMPTOMS TO REPORT IMMEDIATELY:    Following upper endoscopy (EGD)  Vomiting of blood or coffee ground material  New chest pain or pain under the shoulder blades  Painful or persistently difficult swallowing  New shortness of breath  Fever of 100F or higher  Black, tarry-looking stools  For urgent or emergent issues, a gastroenterologist can be reached at any hour by calling [phone].   DIET:  We do recommend a small meal at first, but then you may proceed to your regular diet.  Drink plenty of fluids but you should avoid alcoholic beverages for 24 hours.  MEDICATIONS: Continue present medications.  ACTIVITY:  You should plan to take it easy for the rest of today and you should NOT DRIVE or use heavy machinery until  tomorrow (because of the sedation medicines used during the test).    FOLLOW UP: Our staff will call the number listed on your records the next business day following your procedure to check on you and address any questions or concerns that you may have regarding the information given to you following your procedure. If we do not reach you, we will leave a message.  However, if you are feeling well and you are not experiencing any problems, there is no need to return our call.  We will assume that you have returned to your regular daily activities without incident.  If any biopsies were taken you will be contacted by phone or by letter within the next 1-3 weeks.  Please call us at [phone] if you have not heard about the biopsies in 3 weeks.   Thank you for allowing us to provide for your healthcare needs today.   SIGNATURES/CONFIDENTIALITY: You and/or your care partner have signed paperwork which will be entered into your electronic medical record.  These signatures attest to the fact that that the information above on your After Visit Summary has been reviewed and is understood.  Full responsibility of the confidentiality of this discharge information lies with you and/or your care-partner. 

## 2017-04-25 NOTE — Progress Notes (Signed)
A/ox3 pleased with MAC, report to Sarah RN 

## 2017-04-25 NOTE — Op Note (Addendum)
[facility] Patient Name: Raymond Newman Procedure Date: 04/25/2017 3:33 PM MRN: [id number] Endoscopist: Meryl Dare , MD Age: 32 Referring MD:  Date of Birth: Jul 03, 1985 Gender: Male Account #: [id number] Procedure:                Upper GI endoscopy Indications:              Epigastric abdominal pain, Gastroesophageal reflux                            disease, Unexplained chest pain Medicines:                Monitored Anesthesia Care Procedure:                Pre-Anesthesia Assessment:                           - Prior to the procedure, a History and Physical                            was performed, and patient medications and                            allergies were reviewed. The patient's tolerance of                            previous anesthesia was also reviewed. The risks                            and benefits of the procedure and the sedation                            options and risks were discussed with the patient.                            All questions were answered, and informed consent                            was obtained. Prior Anticoagulants: The patient has                            taken no previous anticoagulant or antiplatelet                            agents. ASA Grade Assessment: II - A patient with                            mild systemic disease. After reviewing the risks                            and benefits, the patient was deemed in                            satisfactory condition to undergo the procedure.  After obtaining informed consent, the endoscope was                            passed under direct vision. Throughout the                            procedure, the patient's blood pressure, pulse, and                            oxygen saturations were monitored continuously. The                            Endoscope was introduced through the mouth, and                            advanced to the second  part of duodenum. The upper                            GI endoscopy was accomplished without difficulty.                            The patient tolerated the procedure well. Scope In: Scope Out: Findings:                 The examined esophagus was normal.                           A single 7 mm submucosal papule (nodule) with no                            bleeding and no stigmata of recent bleeding was                            found in the gastric antrum. Biopsies were taken                            with a cold forceps for histology.                           Patchy mildly erythematous mucosa without bleeding                            was found in the gastric fundus and in the gastric                            body. Biopsies were taken with a cold forceps for                            histology.                           The exam of the stomach was otherwise normal.  The duodenal bulb and second portion of the                            duodenum were normal. Complications:            No immediate complications. Estimated Blood Loss:     Estimated blood loss was minimal. Impression:               - Normal esophagus.                           - A single submucosal papule (nodule) found in the                            stomach. Biopsied.                           - Erythematous mucosa in the gastric fundus and                            gastric body. Biopsied.                           - Normal duodenal bulb and second portion of the                            duodenum. Recommendation:           - Resume previous diet.                           - Continue present medications.                           - Await pathology results.                           - A component of his symptoms could be GI related                            however some of his symptoms are not GI related.                           - Return to referring physician as previously                             scheduled. Meryl Dare, MD 04/25/2017 3:46:04 PM This report has been signed electronically.

## 2017-04-26 ENCOUNTER — Telehealth: Payer: Self-pay | Admitting: Adult Health

## 2017-04-26 ENCOUNTER — Telehealth: Payer: Self-pay | Admitting: *Deleted

## 2017-04-26 ENCOUNTER — Other Ambulatory Visit: Payer: Self-pay | Admitting: Adult Health

## 2017-04-26 DIAGNOSIS — R000 Tachycardia, unspecified: Secondary | ICD-10-CM

## 2017-04-26 NOTE — Telephone Encounter (Signed)
  Follow up Call-  Call back number 04/25/2017  Post procedure Call Back phone  # [phone]  Permission to leave phone message Yes     Patient questions:  Do you have a fever, pain , or abdominal swelling? No. Pain Score  0 *  Have you tolerated food without any problems? Yes.    Have you been able to return to your normal activities? Yes.    Do you have any questions about your discharge instructions: Diet   No. Medications  No. Follow up visit  No.  Do you have questions or concerns about your Care? No  Actions: * If pain score is 4 or above: No action needed, pain <4.

## 2017-04-26 NOTE — Telephone Encounter (Signed)
Spoke with pt and advised of referral. Also advised if any increase in symptoms such as chest pain contact our office immediately. Pt voiced understanding. Nothing further needed.

## 2017-04-26 NOTE — Telephone Encounter (Signed)
I am going to send him to cardiology just to make sure everything is ok   Referral placed

## 2017-04-26 NOTE — Telephone Encounter (Signed)
Please see note from Dr Russella DarStark endoscopy performed yesterday.

## 2017-04-26 NOTE — Telephone Encounter (Signed)
Copied from CRM [phone]. Topic: Quick Communication - See Telephone Encounter >> Apr 26, 2017  8:01 AM Cipriano BunkerLambe, Annette S wrote: CRM for notification.   Pt. Had procedure yesterday and dr. Catalina Pizzaold him to follow up as he had heart out of rhythm (PVC) .  He is asking if he needs to come see Kandee KeenCory or go to a Cardiologist?   He has notice this for 2 1/2 days  Sending it High Priority in case he needs to come in asap   See Telephone encounter for: 04/26/17.

## 2017-04-28 ENCOUNTER — Telehealth: Payer: Self-pay | Admitting: Gastroenterology

## 2017-04-28 ENCOUNTER — Encounter: Payer: Self-pay | Admitting: Adult Health

## 2017-04-28 NOTE — Telephone Encounter (Signed)
Patient reports abdominal pain since procedure.  He says it is a crampy pain and usually before a meal.  He is advised to try room temperature liquids and try Gas-x or phazyme prior to meals.  He will call me back if he continues to have pain.  He denies any other complaints.

## 2017-04-28 NOTE — Telephone Encounter (Signed)
Agree. If symptoms not improved please call back.

## 2017-04-28 NOTE — Telephone Encounter (Signed)
Patient states he had a endo done on Monday 3.25.19 and has since been having stomach problems. Patient requesting call from nurse to discuss if this is normal.

## 2017-05-02 HISTORY — PX: TRANSTHORACIC ECHOCARDIOGRAM: SHX275

## 2017-05-02 HISTORY — PX: OTHER SURGICAL HISTORY: SHX169

## 2017-05-04 ENCOUNTER — Encounter: Payer: Self-pay | Admitting: Gastroenterology

## 2017-05-16 ENCOUNTER — Ambulatory Visit: Payer: 59 | Admitting: Psychology

## 2017-05-16 DIAGNOSIS — F411 Generalized anxiety disorder: Secondary | ICD-10-CM

## 2017-05-17 NOTE — Progress Notes (Signed)
Cardiology Office Note   Date:  05/18/2017   ID:  Raymond KyleRonald Manganiello, DOB 1985/11/08, MRN [id number]  PCP:  Shirline FreesNafziger, Cory, NP  Cardiologist:  [place]  Chief Complaint  Patient presents with  . Palpitations     History of Present Illness: Raymond Newman is a very pleasant  32 y.o. male who presents for cardiac evaluation due to complaints of frequent palpitations, some elevation in HR.  He has a PMH of  GERD, mild anxiety, Rosacea and seasonal allergies.   He states that he has been noticing more frequent palpitations over the last 2 weeks up until recently.  The patient was scheduled for EGD prior to having procedure he noticed a palpitations and heart skipping.  The patient has been trying to work out and exercise, and often noticed post exercise when he got home.  He has taken himself off of caffeine.  He rarely drinks alcohol.  Does not smoke.  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  He states that this can be stressful at times.  As a result of these symptoms and desire to increase his exercise activity he wants to be evaluated further from a cardiac standpoint to be able to move forward with exercise.  He states recently he has not had any further PVCs.  Of note, the patient is on Celexa and he noticed that his heart rate increased and he was having worsening PVCs taking it his primary care physician decreased the dose to half and has helped to resolve his symptoms.   Past Medical History:  Diagnosis Date  . Anxiety    with flying  . GERD (gastroesophageal reflux disease)   . Hyperlipidemia   . Rosacea   . Seasonal allergies     Past Surgical History:  Procedure Laterality Date  . ESOPHAGOGASTRODUODENOSCOPY ENDOSCOPY       Current Outpatient Medications  Medication Sig Dispense Refill  . citalopram (CELEXA) 10 MG tablet Take 1 tablet (10 mg total) by mouth daily. (Patient taking differently: Take 5 mg by mouth daily. ) 30 tablet 3  . Flaxseed, Linseed, (FLAXSEED  OIL PO) Take by mouth.    . metroNIDAZOLE (METROGEL) 0.75 % gel APPLY TO THE AFFECTED SKIN ON THE FACE TWICE DAILY.  0  . Multiple Vitamin (ONE-A-DAY MENS PO) Take 1 tablet by mouth daily.    . NON FORMULARY CBD OIL DAILY    . Omega-3 Fatty Acids (FISH OIL) 1000 MG CAPS Take by mouth.    Marland Kitchen. omeprazole (PRILOSEC) 10 MG capsule Take 10 mg by mouth every morning.     Current Facility-Administered Medications  Medication Dose Route Frequency Provider Last Rate Last Dose  . 0.9 %  sodium chloride infusion  500 mL Intravenous Once Meryl DareStark, Malcolm T, MD        Allergies:   Patient has no known allergies.    Social History:  The patient  reports that he has never smoked. He has never used smokeless tobacco. He reports that he drinks alcohol. He reports that he does not use drugs.   Family History:  The patient's family history includes Hypercholesterolemia in his paternal grandfather; Hyperlipidemia in his father; Hypertension in his father; Lupus in his mother; Melanoma in his mother.    ROS: All other systems are reviewed and negative. Unless otherwise mentioned in H&P    PHYSICAL EXAM: VS:  BP 122/68 (BP Location: Right Arm, Patient Position: Sitting, Cuff Size: Normal)   Pulse 64   Ht 5'  9" (1.753 m)   Wt 164 lb 6.4 oz (74.6 kg)   BMI 24.28 kg/m  , BMI Body mass index is 24.28 kg/m. GEN: Well nourished, well developed, in no acute distress  HEENT: normal  Neck: no JVD, carotid bruits, or masses Cardiac: RRR; soft systolic murmurs, rubs, or gallops,no edema  Respiratory:  Clear to auscultation bilaterally, normal work of breathing GI: soft, nontender, nondistended, + BS MS: no deformity or atrophy  Skin: warm and dry, no rash Neuro:  Strength and sensation are intact Psych: euthymic mood, full affect   EKG:  NSR rate of 64 bpm  Recent Labs: 04/20/2017: ALT 16; BUN 11; Creatinine, Ser 0.92; Hemoglobin 15.6; Platelets 227.0; Potassium 3.9; Sodium 144; TSH 1.07    Lipid Panel     Component Value Date/Time   CHOL 176 04/20/2017 0946   TRIG 78.0 04/20/2017 0946   HDL 47.70 04/20/2017 0946   CHOLHDL 4 04/20/2017 0946   VLDL 15.6 04/20/2017 0946   LDLCALC 113 (H) 04/20/2017 0946      Wt Readings from Last 3 Encounters:  05/18/17 164 lb 6.4 oz (74.6 kg)  04/25/17 168 lb (76.2 kg)  04/20/17 167 lb (75.8 kg)      Other studies Reviewed: Labs in EPIC  ASSESSMENT AND PLAN:  1. Frequent palpitations: Has been having frequent symptoms of heart skipping over the last few months with some improvement after decreasing SSRI, and stopping caffeine. He would like to begin an exercise program but had noticed PVC;s post exercise as well.   Will plan a POET and echocardiogram to evaluate further. Would like to focus on exercise induced arrhythmia and HR response to exercise and recovery PVC's . Echo for structural heart disease.   I have discussed this with the patient who is in agreement with this plan. I have also spoken to Dr. Herbie Baltimore, DOD at [facility] office today. He agrees with my assessment and plan. He will follow up to discuss results. May consider cardiac monitor if symptoms persist or worsen.   2. GERD; On PPI. May need to consider magnesium replacement if necessary due to PPI's causing hypomagnesemia, which can exacerbate PVC's.   3. Anxiety: Followed by PCP. On SSRI with recent reduction in dose improving palpitations.    Current medicines are reviewed at length with the patient today.    Labs/ tests ordered today include: POET and Echo.   Bettey Mare. Liborio Nixon, ANP, AACC   05/18/2017 11:13 AM    [facility] [address] [place], [place] [address] Phone: [phone]; Fax: [phone]

## 2017-05-18 ENCOUNTER — Encounter: Payer: Self-pay | Admitting: Adult Health

## 2017-05-18 ENCOUNTER — Ambulatory Visit: Payer: 59 | Admitting: Adult Health

## 2017-05-18 VITALS — BP 122/68 | HR 64 | Ht 69.0 in | Wt 164.4 lb

## 2017-05-18 DIAGNOSIS — R0602 Shortness of breath: Secondary | ICD-10-CM

## 2017-05-18 DIAGNOSIS — I493 Ventricular premature depolarization: Secondary | ICD-10-CM

## 2017-05-18 NOTE — Patient Instructions (Signed)
Testing/Procedures: Echocardiogram - Your physician has requested that you have an echocardiogram. Echocardiography is a painless test that uses sound waves to create images of your heart. It provides your doctor with information about the size and shape of your heart and how well your heart's chambers and valves are working. This procedure takes approximately one hour. There are no restrictions for this procedure. [facility] - [address], [address].  Your physician has requested that you have an exercise tolerance test. For further information please visit [email or web address] Please also follow instruction sheet, as given.  Follow-Up: Your physician wants you to follow-up in: AFTER TESTING WITH DR HARDING -OR- KATHRYN LAWRENCE (NURSE PRACTIONIER), DNP,AAA IF PRIMARY CARDIOLOGIST IS UNAVAILABLE.     Thank you for choosing [facility]!!

## 2017-05-24 ENCOUNTER — Telehealth (HOSPITAL_COMMUNITY): Payer: Self-pay

## 2017-05-24 NOTE — Telephone Encounter (Signed)
Encounter complete. 

## 2017-05-25 ENCOUNTER — Other Ambulatory Visit: Payer: Self-pay

## 2017-05-25 ENCOUNTER — Ambulatory Visit (HOSPITAL_COMMUNITY): Payer: 59 | Attending: Cardiovascular Disease

## 2017-05-25 DIAGNOSIS — I493 Ventricular premature depolarization: Secondary | ICD-10-CM | POA: Diagnosis not present

## 2017-05-25 DIAGNOSIS — R0602 Shortness of breath: Secondary | ICD-10-CM | POA: Diagnosis not present

## 2017-05-26 ENCOUNTER — Other Ambulatory Visit: Payer: Self-pay | Admitting: Family Medicine

## 2017-05-26 ENCOUNTER — Ambulatory Visit (HOSPITAL_COMMUNITY)
Admission: RE | Admit: 2017-05-26 | Discharge: 2017-05-26 | Disposition: A | Discharge status: Home / Self Care | Payer: 59 | Source: Ambulatory Visit | Attending: Adult Health | Admitting: Adult Health

## 2017-05-26 ENCOUNTER — Ambulatory Visit (HOSPITAL_COMMUNITY): Admission: RE | Admit: 2017-05-26 | Payer: 59 | Source: Ambulatory Visit

## 2017-05-26 DIAGNOSIS — I493 Ventricular premature depolarization: Secondary | ICD-10-CM | POA: Diagnosis not present

## 2017-05-26 DIAGNOSIS — R002 Palpitations: Secondary | ICD-10-CM

## 2017-05-26 DIAGNOSIS — R0602 Shortness of breath: Secondary | ICD-10-CM

## 2017-05-26 DIAGNOSIS — F419 Anxiety disorder, unspecified: Secondary | ICD-10-CM

## 2017-05-26 LAB — EXERCISE TOLERANCE TEST
Estimated workload: 16.2 METS
Exercise duration (min): 13 min
Exercise duration (sec): 30 s
MPHR: 188 {beats}/min
Peak HR: 187 {beats}/min
Percent HR: 99 %
RPE: 17
Rest HR: 80 {beats}/min

## 2017-05-26 MED ORDER — CITALOPRAM HYDROBROMIDE 10 MG PO TABS: 5.0000 mg | ORAL_TABLET | Freq: Every day | ORAL | 3 refills | Status: DC

## 2017-05-26 NOTE — Telephone Encounter (Signed)
Sent to the pharmacy by e-scribe. 

## 2017-06-06 ENCOUNTER — Ambulatory Visit (INDEPENDENT_AMBULATORY_CARE_PROVIDER_SITE_OTHER): Payer: 59 | Admitting: Psychology

## 2017-06-06 DIAGNOSIS — F411 Generalized anxiety disorder: Secondary | ICD-10-CM | POA: Diagnosis not present

## 2017-06-09 ENCOUNTER — Encounter: Payer: Self-pay | Admitting: Cardiology

## 2017-06-09 ENCOUNTER — Ambulatory Visit: Payer: 59 | Admitting: Cardiology

## 2017-06-09 DIAGNOSIS — I493 Ventricular premature depolarization: Secondary | ICD-10-CM | POA: Diagnosis not present

## 2017-06-09 NOTE — Patient Instructions (Signed)
NO CHANGES CURRENTLY    Your physician wants you to follow-up on an as needed basis.

## 2017-06-09 NOTE — Progress Notes (Signed)
PCP: Dorothyann Peng, NP  Clinic Note: Chief Complaint  Patient presents with  . Follow-up    .  Study results reviewed    HPI: Raymond Newman is a 32 y.o. male with a PMH below who presents today for  1 monthfollow-up cardiology evaluation --follow-up evaluation for palpitations.  Raymond Newman is a 32 y.o. male who was last seen on May 18, 2017 by Jory Sims, NP (with me serving as attending)  at the request of Dorothyann Peng, NP.  Noting frequent palpitations over the past 2 weeks --prior to his visit.  Noting rapid heartbeats and skipping beats.  Noticed that these occurred following exercise, not during exercise. --> Evaluation plan was echocardiogram GXT (plain old exercise test = POET) as well as a 48-hour Holter monitor.    Recent Hospitalizations: None  Studies Personally Reviewed - (if available, images/films reviewed: From Epic Chart or Care Everywhere) From April 2019  GXT: Normal blood pressure response.  No ischemia.  No symptoms.  LOW RISK. Walked 13.5 minutes.  Achieved heart rate 187 bpm which is 90% max protected.  16.2 MET  Echo: Normal LV size and function.  EF 60-65%.  No wall motion normality.  Normal diastolic function.  No valve disease.   Holter monitor: Sinus rhythm with sinus tachycardia --associated with anxiety  Interval History: Raymond Newman returns here today overall feeling better.  He states that he really only felt short of breath when he was having premature heart skipping, and has noted that this is been more rare than it had been before.  He has worked on try to cut back any caffeine intake.  He states that he is only having about 2-3 little spells a day and is relatively relieved with the results of his studies. He denies any cardiac symptoms such as chest pain or shortness of breath with rest or exertion. No PND, orthopnea or edema.  He still has palpitations, but denies any associated that he take his breath away lightheadedness, dizziness,  weakness or syncope/near syncope. No TIA/amaurosis fugax symptoms. No melena, hematochezia, hematuria, or epstaxis. No claudication.  Review of Systems  Constitutional: Negative for malaise/fatigue.  Neurological: Negative for dizziness and loss of consciousness.  Psychiatric/Behavioral: The patient is not nervous/anxious (Actually feels much better about having a good result from his evaluation).    I have reviewed and (if needed) personally updated the patient's problem list, medications, allergies, past medical and surgical history, social and family history.   Past Medical History:  Diagnosis Date  . Anxiety    with flying  . GERD (gastroesophageal reflux disease)   . Hyperlipidemia   . Rosacea   . Seasonal allergies     Past Surgical History:  Procedure Laterality Date  . 48 HR HOLTER MONITOR  05/2017   Mostly sinus rhythm with sinus tachycardia.  Tachycardia associated with anxiety.  No notable PACs or PVCs noted.  No arrhythmia.  Marland Kitchen ESOPHAGOGASTRODUODENOSCOPY ENDOSCOPY    . GRADUATED EXERCISE TOLERANCE TEST (GXT/ETT)  05/2017   Normal blood pressure response.  No ischemia.  No symptoms.  LOW RISK. Walked 13.5 minutes.  Achieved heart rate 187 bpm which is 90% max protected.  16.2 MET --minimal PVCs noted prior to and after completion of exercise.  No exertional PVCs.  . TRANSTHORACIC ECHOCARDIOGRAM  05/2017   Normal LV size and function.  EF 60-65%.  No wall motion normality.  Normal diastolic function.  No valve disease.     Current Meds  Medication Sig  .  citalopram (CELEXA) 10 MG tablet Take 0.5 tablets (5 mg total) by mouth daily.  . Flaxseed, Linseed, (FLAXSEED OIL PO) Take by mouth.  . metroNIDAZOLE (METROGEL) 0.75 % gel APPLY TO THE AFFECTED SKIN ON THE FACE TWICE DAILY.  . Multiple Vitamin (ONE-A-DAY MENS PO) Take 1 tablet by mouth daily.  . NON FORMULARY CBD OIL DAILY  . Omega-3 Fatty Acids (FISH OIL) 1000 MG CAPS Take by mouth.  Marland Kitchen omeprazole (PRILOSEC) 10 MG  capsule Take 10 mg by mouth every morning.   Current Facility-Administered Medications for the 06/09/17 encounter (Office Visit) with Leonie Man, MD  Medication  . 0.9 %  sodium chloride infusion    No Known Allergies  Social History   Tobacco Use  . Smoking status: Never Smoker  . Smokeless tobacco: Never Used  Substance Use Topics  . Alcohol use: Yes    Alcohol/week: 0.0 oz    Comment: beer daily   . Drug use: No   Social History   Social History Narrative   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[employer]    Has a girlfriend    Just bought a house in December        family history includes Hypercholesterolemia in his paternal grandfather; Hyperlipidemia in his father; Hypertension in his father; Lupus in his mother; Melanoma in his mother.  Wt Readings from Last 3 Encounters:  06/09/17 165 lb 12.8 oz (75.2 kg)  05/18/17 164 lb 6.4 oz (74.6 kg)  04/25/17 168 lb (76.2 kg)    PHYSICAL EXAM BP 138/82   Pulse 82   Ht 5' 9"  (1.753 m)   Wt 165 lb 12.8 oz (75.2 kg)   BMI 24.48 kg/m  Physical Exam  Constitutional: He is oriented to person, place, and time. He appears well-developed and well-nourished. No distress.  Healthy-appearing.  Well-groomed  HENT:  Head: Normocephalic and atraumatic.  Neck: No hepatojugular reflux and no JVD present. Carotid bruit is not present.  Cardiovascular: Normal rate, regular rhythm, normal heart sounds and intact distal pulses. Exam reveals no gallop and no friction rub.  No murmur heard. Pulmonary/Chest: Effort normal and breath sounds normal. No respiratory distress. He has no wheezes. He has no rales.  Musculoskeletal: Normal range of motion. He exhibits no edema.  Neurological: He is alert and oriented to person, place, and time.  Psychiatric: He has a normal mood and affect. His behavior is normal. Judgment and thought content normal.  Vitals reviewed.   Adult ECG Report Not checked  Other studies Reviewed: Additional studies/  records that were reviewed today include:  Recent Labs:   Lab Results  Component Value Date   CREATININE 0.92 04/20/2017   BUN 11 04/20/2017   NA 144 04/20/2017   K 3.9 04/20/2017   CL 106 04/20/2017   CO2 26 04/20/2017   Lab Results  Component Value Date   TSH 1.07 04/20/2017   Lab Results  Component Value Date   WBC 6.2 04/20/2017   HGB 15.6 04/20/2017   HCT 45.0 04/20/2017   MCV 88.9 04/20/2017   PLT 227.0 04/20/2017    ASSESSMENT / PLAN: Problem List Items Addressed This Visit    PVC's (premature ventricular contractions)    Relatively rare PVCs noted on monitor.  He did have some during the resting phase of his stress test and initial walking as well as near the end of exercise.  Thankfully, they did not get worse with exercise.  Most likely benign given the normal echocardiogram.  He was quite relieved with these findings.  Continue to avoid caffeine and sugar/chocolate as triggers.         Current medicines are reviewed at length with the patient today.  (+/- concerns) n/a The following changes have been made:  n/a  Patient Instructions  NO CHANGES CURRENTLY    Your physician wants you to follow-up on an as needed basis.    Studies Ordered:   No orders of the defined types were placed in this encounter.     Glenetta Hew, M.D., M.S. Interventional Cardiologist   Pager # [phone] Phone # [phone] [address] [place], [place] [address]   Thank you for choosing [facility] at [facility]!!

## 2017-06-12 ENCOUNTER — Encounter: Payer: Self-pay | Admitting: Cardiology

## 2017-06-12 NOTE — Assessment & Plan Note (Signed)
Relatively rare PVCs noted on monitor.  He did have some during the resting phase of his stress test and initial walking as well as near the end of exercise.  Thankfully, they did not get worse with exercise.  Most likely benign given the normal echocardiogram.  He was quite relieved with these findings.  Continue to avoid caffeine and sugar/chocolate as triggers.

## 2017-06-13 ENCOUNTER — Ambulatory Visit: Payer: 59 | Admitting: Psychology

## 2017-06-13 DIAGNOSIS — F411 Generalized anxiety disorder: Secondary | ICD-10-CM

## 2017-06-22 ENCOUNTER — Other Ambulatory Visit: Payer: Self-pay | Admitting: Adult Health

## 2017-06-22 DIAGNOSIS — R002 Palpitations: Secondary | ICD-10-CM

## 2017-06-22 DIAGNOSIS — F419 Anxiety disorder, unspecified: Secondary | ICD-10-CM

## 2017-06-22 NOTE — Telephone Encounter (Signed)
Denied.  This medication was sent in on 05/26/17.  Message sent to the pharmacy to check file.

## 2017-07-13 ENCOUNTER — Ambulatory Visit: Payer: Self-pay | Admitting: Psychology

## 2017-07-25 ENCOUNTER — Ambulatory Visit (INDEPENDENT_AMBULATORY_CARE_PROVIDER_SITE_OTHER): Payer: 59 | Admitting: Psychology

## 2017-07-25 DIAGNOSIS — F411 Generalized anxiety disorder: Secondary | ICD-10-CM | POA: Diagnosis not present

## 2017-08-03 ENCOUNTER — Encounter: Payer: Self-pay | Admitting: Adult Health

## 2017-08-03 DIAGNOSIS — F419 Anxiety disorder, unspecified: Secondary | ICD-10-CM

## 2017-08-03 DIAGNOSIS — R002 Palpitations: Secondary | ICD-10-CM

## 2017-08-03 MED ORDER — CITALOPRAM HYDROBROMIDE 10 MG PO TABS
10.0000 mg | ORAL_TABLET | Freq: Every day | ORAL | 1 refills | Status: DC
Start: 2017-08-03 — End: 2018-03-21

## 2017-08-08 ENCOUNTER — Ambulatory Visit: Payer: 59 | Admitting: Psychology

## 2017-08-15 ENCOUNTER — Ambulatory Visit: Payer: 59 | Admitting: Psychology

## 2017-08-22 ENCOUNTER — Ambulatory Visit: Payer: 59 | Admitting: Psychology

## 2017-09-19 ENCOUNTER — Ambulatory Visit (INDEPENDENT_AMBULATORY_CARE_PROVIDER_SITE_OTHER): Payer: Self-pay | Admitting: Psychology

## 2017-09-19 DIAGNOSIS — F411 Generalized anxiety disorder: Secondary | ICD-10-CM

## 2017-10-17 ENCOUNTER — Telehealth: Payer: Self-pay

## 2017-10-17 ENCOUNTER — Ambulatory Visit (INDEPENDENT_AMBULATORY_CARE_PROVIDER_SITE_OTHER): Payer: BLUE CROSS/BLUE SHIELD | Admitting: Psychology

## 2017-10-17 DIAGNOSIS — F411 Generalized anxiety disorder: Secondary | ICD-10-CM | POA: Diagnosis not present

## 2017-10-17 NOTE — Telephone Encounter (Signed)
Copied from CRM [phone]. Topic: General - Other >> Oct 17, 2017 12:43 PM Gaynelle AduPoole, Shalonda wrote: Reason for CRM: patient is calling to see if he is able to have a prescription called in for his anxiety, he is requesting a low dose of xanax, his best contact number is [phone]

## 2017-10-18 NOTE — Telephone Encounter (Signed)
Spoke to the pt and advised that he will need to see Kandee KeenCory to discuss additional medication/treatment.  [facility]  He continues to take his citalopram.

## 2017-10-21 ENCOUNTER — Ambulatory Visit: Payer: BLUE CROSS/BLUE SHIELD | Admitting: Adult Health

## 2017-10-21 ENCOUNTER — Encounter: Payer: Self-pay | Admitting: Adult Health

## 2017-10-21 VITALS — BP 118/80 | HR 82 | Temp 98.2°F | Wt 178.0 lb

## 2017-10-21 DIAGNOSIS — F40243 Fear of flying: Secondary | ICD-10-CM

## 2017-10-21 MED ORDER — ALPRAZOLAM 0.5 MG PO TABS: ORAL_TABLET | ORAL | 0 refills | Status: DC

## 2017-10-21 NOTE — Progress Notes (Signed)
Subjective:    Patient ID: Raymond Newman, male    DOB: [birth year], 32 y.o.   MRN: [id number]  HPI  32 year old male who  has a past medical history of Anxiety, GERD (gastroesophageal reflux disease), Hyperlipidemia, Rosacea, and Seasonal allergies.  He presents to the office today for follow up regarding anxiety. Reports that Celexa is working well for him. He has recently changed jobs and is Merchandiser, retail at [employer]. He is enjoying his new career and finds that his stress level has decreased. He is seeing Dr. Cheryln Manly and finds this helpful   He is flying to [place] for his honeymoon and is wondering if he can get something for anxiety from [facility].   Review of Systems See HPI   Past Medical History:  Diagnosis Date  . Anxiety    with flying  . GERD (gastroesophageal reflux disease)   . Hyperlipidemia   . Rosacea   . Seasonal allergies     Social History   Socioeconomic History  . Marital status: Married    Spouse name: Raquel Sarna  . Number of children: 0  . Years of education: Not on file  . Highest education level: Not on file  Occupational History  . Occupation: Chef    Comment: [employer]  Social Needs  . Financial resource strain: Not on file  . Food insecurity:    Worry: Not on file    Inability: Not on file  . Transportation needs:    Medical: Not on file    Non-medical: Not on file  Tobacco Use  . Smoking status: Never Smoker  . Smokeless tobacco: Never Used  Substance and Sexual Activity  . Alcohol use: Yes    Alcohol/week: 0.0 standard drinks    Comment: beer daily   . Drug use: No  . Sexual activity: Yes    Partners: Female  Lifestyle  . Physical activity:    Days per week: Not on file    Minutes per session: Not on file  . Stress: Not on file  Relationships  . Social connections:    Talks on phone: Not on file    Gets together: Not on file    Attends religious service: Not on file    Active member of club or  organization: Not on file    Attends meetings of clubs or organizations: Not on file    Relationship status: Not on file  . Intimate partner violence:    Fear of current or ex partner: Not on file    Emotionally abused: Not on file    Physically abused: Not on file    Forced sexual activity: Not on file  Other Topics Concern  . 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 48 HR HOLTER MONITOR  05/2017   Mostly sinus rhythm with sinus tachycardia.  Tachycardia associated with anxiety.  No notable PACs or PVCs noted.  No arrhythmia.  Marland Kitchen ESOPHAGOGASTRODUODENOSCOPY ENDOSCOPY    . GRADUATED EXERCISE TOLERANCE TEST (GXT/ETT)  05/2017   Normal blood pressure response.  No ischemia.  No symptoms.  LOW RISK. Walked 13.5 minutes.  Achieved heart rate 187 bpm which is 90% max protected.  16.2 MET --minimal PVCs noted prior to and after completion  of exercise.  No exertional PVCs.  . TRANSTHORACIC ECHOCARDIOGRAM  05/2017   Normal LV size and function.  EF 60-65%.  No wall motion normality.  Normal diastolic function.  No valve disease.     Family History  Problem Relation Age of Onset  . Hypertension Father   . Hyperlipidemia Father   . Melanoma Mother   . Lupus Mother   . Hypercholesterolemia Paternal Grandfather   . Colon cancer Neg Hx   . Stomach cancer Neg Hx   . Rectal cancer Neg Hx   . Esophageal cancer Neg Hx     No Known Allergies  Current Outpatient Medications on File Prior to Visit  Medication Sig Dispense Refill  . citalopram (CELEXA) 10 MG tablet Take 1 tablet (10 mg total) by mouth daily. 90 tablet 1  . Flaxseed, Linseed, (FLAXSEED OIL PO) Take by mouth.    . metroNIDAZOLE (METROGEL) 0.75 % gel APPLY TO THE AFFECTED SKIN ON THE FACE TWICE DAILY.  0  . Multiple Vitamin (ONE-A-DAY MENS PO) Take 1 tablet by mouth daily.    .  NON FORMULARY CBD OIL DAILY    . Omega-3 Fatty Acids (FISH OIL) 1000 MG CAPS Take by mouth.    Marland Kitchen omeprazole (PRILOSEC) 10 MG capsule Take 10 mg by mouth every morning.     Current Facility-Administered Medications on File Prior to Visit  Medication Dose Route Frequency Provider Last Rate Last Dose  . 0.9 %  sodium chloride infusion  500 mL Intravenous Once Ladene Artist, MD        BP 118/80 (BP Location: Left Arm, Patient Position: Sitting, Cuff Size: Normal)   Pulse 82   Temp 98.2 F (36.8 C) (Oral)   Wt 178 lb (80.7 kg)   SpO2 98%   BMI 26.29 kg/m       Objective:   Physical Exam  Constitutional: He is oriented to person, place, and time. He appears well-developed and well-nourished. No distress.  Cardiovascular: Normal rate, regular rhythm, normal heart sounds and intact distal pulses.  Pulmonary/Chest: Effort normal and breath sounds normal.  Neurological: He is alert and oriented to person, place, and time.  Skin: Skin is warm and dry. He is not diaphoretic.  Psychiatric: He has a normal mood and affect. His behavior is normal. Judgment and thought content normal.  Nursing note and vitals reviewed.     Assessment & Plan:

## 2017-10-31 ENCOUNTER — Ambulatory Visit: Payer: BLUE CROSS/BLUE SHIELD | Admitting: Psychology

## 2017-11-14 ENCOUNTER — Ambulatory Visit: Payer: Self-pay | Admitting: Psychology

## 2017-12-19 ENCOUNTER — Ambulatory Visit: Payer: BLUE CROSS/BLUE SHIELD | Admitting: Psychology

## 2017-12-19 DIAGNOSIS — F411 Generalized anxiety disorder: Secondary | ICD-10-CM

## 2017-12-20 ENCOUNTER — Other Ambulatory Visit: Payer: Self-pay | Admitting: Family Medicine

## 2017-12-20 ENCOUNTER — Encounter: Payer: Self-pay | Admitting: Adult Health

## 2017-12-20 DIAGNOSIS — I8290 Acute embolism and thrombosis of unspecified vein: Secondary | ICD-10-CM

## 2017-12-22 ENCOUNTER — Ambulatory Visit
Admission: RE | Admit: 2017-12-22 | Discharge: 2017-12-22 | Disposition: A | Discharge status: Home / Self Care | Payer: BLUE CROSS/BLUE SHIELD | Source: Ambulatory Visit | Attending: Family Medicine | Admitting: Family Medicine

## 2017-12-22 ENCOUNTER — Other Ambulatory Visit: Payer: Self-pay

## 2017-12-22 DIAGNOSIS — I8290 Acute embolism and thrombosis of unspecified vein: Secondary | ICD-10-CM

## 2018-01-16 ENCOUNTER — Ambulatory Visit: Payer: BLUE CROSS/BLUE SHIELD | Admitting: Adult Health

## 2018-01-16 ENCOUNTER — Ambulatory Visit (INDEPENDENT_AMBULATORY_CARE_PROVIDER_SITE_OTHER): Payer: BLUE CROSS/BLUE SHIELD | Admitting: Psychology

## 2018-01-16 DIAGNOSIS — F411 Generalized anxiety disorder: Secondary | ICD-10-CM

## 2018-01-23 ENCOUNTER — Ambulatory Visit: Payer: Self-pay | Admitting: Family Medicine

## 2018-02-13 ENCOUNTER — Ambulatory Visit: Payer: Self-pay | Admitting: Psychology

## 2018-03-20 ENCOUNTER — Other Ambulatory Visit: Payer: Self-pay | Admitting: Adult Health

## 2018-03-20 DIAGNOSIS — R002 Palpitations: Secondary | ICD-10-CM

## 2018-03-20 DIAGNOSIS — F419 Anxiety disorder, unspecified: Secondary | ICD-10-CM

## 2018-03-21 NOTE — Telephone Encounter (Signed)
Sent to the pharmacy by e-scribe. 

## 2018-04-01 ENCOUNTER — Telehealth: Payer: Self-pay | Admitting: Physician Assistant

## 2018-04-01 NOTE — Telephone Encounter (Signed)
Paged with palpitation. Prior hx symptomatic PVCs. No symptoms until 2 weeks. He is experiencing intermittent palpitations , lasting for 3-5 seconds, few times/week. Longest 30 seconds. About a year ago, it was for days. No other associated symptoms. Wife is pregnant.   He started to having symptoms since he as increased caffeine intake and alcohol drinking. Advised to cat back and call us if no improve. Patient voice understanding and appreciative of call.

## 2018-04-03 ENCOUNTER — Ambulatory Visit: Payer: BLUE CROSS/BLUE SHIELD | Admitting: Psychology

## 2018-04-03 DIAGNOSIS — F411 Generalized anxiety disorder: Secondary | ICD-10-CM | POA: Diagnosis not present

## 2018-04-26 ENCOUNTER — Telehealth: Payer: Self-pay | Admitting: Cardiology

## 2018-04-26 NOTE — Telephone Encounter (Signed)
Pt called to report that he had noticed when he was brushing his teeth.. that he can see his pulse beating in his neck.. he denies headache, dizziness, palpitations.. but thought it was odd since he had not noticed it before.. I advised him to monitor his symptoms and if anything worsens or changes top call us and to keep his follow up with his PMD Dr. Evelene Croon on 05/03/18 and it may be a good idea for him to check his BP and he says he will when he gets home later this afternoon.

## 2018-04-26 NOTE — Telephone Encounter (Signed)
Patient called because he has noticed his pulse beating right underneath his adams apple. He wants to make sure that is ok, and make sure that it is a "normal thing because he just finds it to be kind of bizarre".

## 2018-05-03 ENCOUNTER — Encounter: Payer: Self-pay | Admitting: Adult Health

## 2018-06-28 ENCOUNTER — Other Ambulatory Visit: Payer: Self-pay | Admitting: Adult Health

## 2018-06-28 DIAGNOSIS — R002 Palpitations: Secondary | ICD-10-CM

## 2018-06-28 DIAGNOSIS — F419 Anxiety disorder, unspecified: Secondary | ICD-10-CM

## 2018-06-29 ENCOUNTER — Ambulatory Visit (INDEPENDENT_AMBULATORY_CARE_PROVIDER_SITE_OTHER): Payer: BLUE CROSS/BLUE SHIELD | Admitting: Adult Health

## 2018-06-29 ENCOUNTER — Other Ambulatory Visit: Payer: Self-pay

## 2018-06-29 ENCOUNTER — Encounter: Payer: Self-pay | Admitting: Adult Health

## 2018-06-29 VITALS — BP 160/90 | Temp 98.3°F | Wt 196.0 lb

## 2018-06-29 DIAGNOSIS — F419 Anxiety disorder, unspecified: Secondary | ICD-10-CM | POA: Diagnosis not present

## 2018-06-29 DIAGNOSIS — Z0000 Encounter for general adult medical examination without abnormal findings: Secondary | ICD-10-CM

## 2018-06-29 DIAGNOSIS — I493 Ventricular premature depolarization: Secondary | ICD-10-CM | POA: Diagnosis not present

## 2018-06-29 LAB — CBC WITH DIFFERENTIAL/PLATELET
Basophils Absolute: 0.1 10*3/uL (ref 0.0–0.1)
Basophils Relative: 0.9 % (ref 0.0–3.0)
Eosinophils Absolute: 0.1 10*3/uL (ref 0.0–0.7)
Eosinophils Relative: 1.5 % (ref 0.0–5.0)
HCT: 44.5 % (ref 39.0–52.0)
Hemoglobin: 15.8 g/dL (ref 13.0–17.0)
Lymphocytes Relative: 25.2 % (ref 12.0–46.0)
Lymphs Abs: 1.7 10*3/uL (ref 0.7–4.0)
MCHC: 35.5 g/dL (ref 30.0–36.0)
MCV: 87.8 fl (ref 78.0–100.0)
Monocytes Absolute: 0.6 10*3/uL (ref 0.1–1.0)
Monocytes Relative: 9.7 % (ref 3.0–12.0)
Neutro Abs: 4.1 10*3/uL (ref 1.4–7.7)
Neutrophils Relative %: 62.7 % (ref 43.0–77.0)
Platelets: 235 10*3/uL (ref 150.0–400.0)
RBC: 5.07 Mil/uL (ref 4.22–5.81)
RDW: 12.4 % (ref 11.5–15.5)
WBC: 6.6 10*3/uL (ref 4.0–10.5)

## 2018-06-29 LAB — LIPID PANEL
Cholesterol: 220 mg/dL — ABNORMAL HIGH (ref 0–200)
HDL: 48.5 mg/dL (ref >39.00)
LDL Cholesterol: 147 mg/dL — ABNORMAL HIGH (ref 0–99)
NonHDL: 171.02
Total CHOL/HDL Ratio: 5
Triglycerides: 121 mg/dL (ref 0.0–149.0)
VLDL: 24.2 mg/dL (ref 0.0–40.0)

## 2018-06-29 LAB — COMPREHENSIVE METABOLIC PANEL
ALT: 22 U/L (ref 0–53)
AST: 15 U/L (ref 0–37)
Albumin: 4.5 g/dL (ref 3.5–5.2)
Alkaline Phosphatase: 68 U/L (ref 39–117)
BUN: 13 mg/dL (ref 6–23)
CO2: 30 mEq/L (ref 19–32)
Calcium: 9.8 mg/dL (ref 8.4–10.5)
Chloride: 102 mEq/L (ref 96–112)
Creatinine, Ser: 0.96 mg/dL (ref 0.40–1.50)
GFR: 89.78 mL/min (ref >60.00)
Glucose, Bld: 102 mg/dL — ABNORMAL HIGH (ref 70–99)
Potassium: 4.2 mEq/L (ref 3.5–5.1)
Sodium: 140 mEq/L (ref 135–145)
Total Bilirubin: 1.2 mg/dL (ref 0.2–1.2)
Total Protein: 6.5 g/dL (ref 6.0–8.3)

## 2018-06-29 LAB — TSH: TSH: 1.51 u[IU]/mL (ref 0.35–4.50)

## 2018-06-29 MED ORDER — ALPRAZOLAM 0.5 MG PO TABS: ORAL_TABLET | ORAL | 0 refills | Status: DC

## 2018-06-29 MED ORDER — CITALOPRAM HYDROBROMIDE 10 MG PO TABS: 10.0000 mg | ORAL_TABLET | Freq: Every day | ORAL | 1 refills | Status: DC

## 2018-06-29 NOTE — Progress Notes (Signed)
Subjective:    Patient ID: Raymond Newman, male    DOB: 09/19/1985, 33 y.o.   MRN: [id number]  HPI Patient presents for yearly preventative medicine examination. He is a pleasant 33 year old who  has a past medical history of Anxiety, GERD (gastroesophageal reflux disease), Rosacea, and Seasonal allergies.  Anxiety - takes Celexa 10 mg. In the past we have tried increasing to 20 mg and he did not tolerate this as it made his anxiety worse. He feels as though he is well controlled on 10 mg dose. Does have a prescription for Xanax that he very rarely uses.   Palpitations -has been worked up by cardiology in the past.  Echo and stress test were within normal limits. Reports he continues to have intermittent episodes of palpitations but it is no where near what was in the past.   All immunizations and health maintenance protocols were reviewed with the patient and needed orders were placed.  Appropriate screening laboratory values were ordered for the patient including screening of hyperlipidemia, renal function and hepatic function.  Medication reconciliation,  past medical history, social history, problem list and allergies were reviewed in detail with the patient  Goals were established with regard to weight loss, exercise, and  diet in compliance with medications. He has not been exercising as much as he would like and is snacking more at home and at work.  Wt Readings from Last 3 Encounters:  06/29/18 196 lb (88.9 kg)  10/21/17 178 lb (80.7 kg)  06/09/17 165 lb 12.8 oz (75.2 kg)    Review of Systems  Constitutional: Negative.   HENT: Negative.   Eyes: Negative.   Respiratory: Negative.   Cardiovascular: Negative.   Gastrointestinal: Negative.   Endocrine: Negative.   Genitourinary: Negative.   Musculoskeletal: Negative.   Skin: Negative.   Allergic/Immunologic: Negative.   Neurological: Negative.   Hematological: Negative.   Psychiatric/Behavioral: The patient is  nervous/anxious.   All other systems reviewed and are negative.  Past Medical History:  Diagnosis Date  . Anxiety    with flying  . GERD (gastroesophageal reflux disease)   . Rosacea   . Seasonal allergies     Social History   Socioeconomic History  . Marital status: Married    Spouse name: Raquel Sarna  . Number of children: 0  . Years of education: Not on file  . Highest education level: Not on file  Occupational History  . Occupation: Chef    Comment: [employer]  Social Needs  . Financial resource strain: Not on file  . Food insecurity:    Worry: Not on file    Inability: Not on file  . Transportation needs:    Medical: Not on file    Non-medical: Not on file  Tobacco Use  . Smoking status: Never Smoker  . Smokeless tobacco: Never Used  Substance and Sexual Activity  . Alcohol use: Yes    Alcohol/week: 0.0 standard drinks    Comment: beer daily   . Drug use: No  . Sexual activity: Yes    Partners: Female  Lifestyle  . Physical activity:    Days per week: Not on file    Minutes per session: Not on file  . Stress: Not on file  Relationships  . Social connections:    Talks on phone: Not on file    Gets together: Not on file    Attends religious service: Not on file    Active member of club  or organization: Not on file    Attends meetings of clubs or organizations: Not on file    Relationship status: Not on file  . Intimate partner violence:    Fear of current or ex partner: Not on file    Emotionally abused: Not on file    Physically abused: Not on file    Forced sexual activity: Not on file  Other Topics Concern  . 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 48 HR HOLTER MONITOR  05/2017   Mostly sinus rhythm with sinus tachycardia.  Tachycardia associated with anxiety.  No notable PACs or  PVCs noted.  No arrhythmia.  Marland Kitchen ESOPHAGOGASTRODUODENOSCOPY ENDOSCOPY    . GRADUATED EXERCISE TOLERANCE TEST (GXT/ETT)  05/2017   Normal blood pressure response.  No ischemia.  No symptoms.  LOW RISK. Walked 13.5 minutes.  Achieved heart rate 187 bpm which is 90% max protected.  16.2 MET --minimal PVCs noted prior to and after completion of exercise.  No exertional PVCs.  . TRANSTHORACIC ECHOCARDIOGRAM  05/2017   Normal LV size and function.  EF 60-65%.  No wall motion normality.  Normal diastolic function.  No valve disease.     Family History  Problem Relation Age of Onset  . Hypertension Father   . Hyperlipidemia Father   . Melanoma Mother   . Lupus Mother   . Hypercholesterolemia Paternal Grandfather   . Colon cancer Neg Hx   . Stomach cancer Neg Hx   . Rectal cancer Neg Hx   . Esophageal cancer Neg Hx     No Known Allergies  Current Outpatient Medications on File Prior to Visit  Medication Sig Dispense Refill  . ALPRAZolam (XANAX) 0.5 MG tablet Take 0.5 -1 tab 30 minutes prior to flight 5 tablet 0  . citalopram (CELEXA) 10 MG tablet TAKE 1 TABLET BY MOUTH EVERY DAY 90 tablet 0  . Flaxseed, Linseed, (FLAXSEED OIL PO) Take by mouth.    . metroNIDAZOLE (METROGEL) 0.75 % gel APPLY TO THE AFFECTED SKIN ON THE FACE TWICE DAILY.  0  . Multiple Vitamin (ONE-A-DAY MENS PO) Take 1 tablet by mouth daily.    . Omega-3 Fatty Acids (FISH OIL) 1000 MG CAPS Take by mouth.    Marland Kitchen omeprazole (PRILOSEC OTC) 20 MG tablet Take 20 mg by mouth daily.     Current Facility-Administered Medications on File Prior to Visit  Medication Dose Route Frequency Provider Last Rate Last Dose  . 0.9 %  sodium chloride infusion  500 mL Intravenous Once Lucio Edward T, MD        BP (!) 160/90   Temp 98.3 F (36.8 C)   Wt 196 lb (88.9 kg)   BMI 28.94 kg/m       Objective:   Physical Exam Vitals signs and nursing note reviewed.  Constitutional:      General: He is not in acute distress.    Appearance:  Normal appearance. He is well-developed. He is not ill-appearing, toxic-appearing or diaphoretic.  HENT:     Head: Normocephalic and atraumatic.     Right Ear: Tympanic membrane, ear canal and external ear normal. There is no impacted cerumen.     Left Ear: Tympanic membrane, ear canal and external ear normal. There is no impacted  cerumen.     Nose: Nose normal. No congestion or rhinorrhea.     Mouth/Throat:     Mouth: Mucous membranes are moist.     Pharynx: Oropharynx is clear. No oropharyngeal exudate or posterior oropharyngeal erythema.  Eyes:     General: No scleral icterus.       Right eye: No discharge.        Left eye: No discharge.     Extraocular Movements: Extraocular movements intact.     Conjunctiva/sclera: Conjunctivae normal.     Pupils: Pupils are equal, round, and reactive to light.  Neck:     Musculoskeletal: Normal range of motion and neck supple. No neck rigidity or muscular tenderness.     Thyroid: No thyromegaly.     Vascular: No carotid bruit.     Trachea: No tracheal deviation.  Cardiovascular:     Rate and Rhythm: Normal rate and regular rhythm.     Heart sounds: Normal heart sounds. No murmur. No friction rub. No gallop.   Pulmonary:     Effort: Pulmonary effort is normal. No respiratory distress.     Breath sounds: Normal breath sounds. No stridor. No wheezing, rhonchi or rales.  Chest:     Chest wall: No tenderness.  Abdominal:     General: Bowel sounds are normal. There is no distension.     Palpations: Abdomen is soft. There is no mass.     Tenderness: There is no abdominal tenderness. There is no right CVA tenderness, left CVA tenderness, guarding or rebound.     Hernia: No hernia is present.  Musculoskeletal: Normal range of motion.        General: No swelling, tenderness, deformity or signs of injury.     Left lower leg: No edema.  Lymphadenopathy:     Cervical: No cervical adenopathy.  Skin:    General: Skin is warm and dry.     Coloration:  Skin is not jaundiced or pale.     Findings: No bruising, erythema, lesion or rash.  Neurological:     General: No focal deficit present.     Mental Status: He is alert and oriented to person, place, and time.     Cranial Nerves: No cranial nerve deficit.     Coordination: Coordination normal.  Psychiatric:        Mood and Affect: Mood normal.        Thought Content: Thought content normal.        Judgment: Judgment normal.       Assessment & Plan:  1. Routine general medical examination at a health care facility - Weight is up, encouraged diet and exercise.  - One year follow up unless needed for an acute issue - CBC with Differential/Platelet - Comprehensive metabolic panel - Lipid panel - TSH  2. Anxiety - Continue with current medication  - citalopram (CELEXA) 10 MG tablet; Take 1 tablet (10 mg total) by mouth daily.  Dispense: 90 tablet; Refill: 1 - ALPRAZolam (XANAX) 0.5 MG tablet; Take 0.5 -1 tab 30 minutes prior to flight  Dispense: 5 tablet; Refill: 0  3. PVC's (premature ventricular contractions) - Continue to monitor    Dorothyann Peng, NP

## 2018-06-29 NOTE — Telephone Encounter (Signed)
DENIED.  FILLED EARLIER TODAY IN OV.

## 2018-08-02 ENCOUNTER — Encounter: Payer: Self-pay | Admitting: Adult Health

## 2018-12-19 ENCOUNTER — Other Ambulatory Visit: Payer: Self-pay | Admitting: Adult Health

## 2018-12-19 DIAGNOSIS — F419 Anxiety disorder, unspecified: Secondary | ICD-10-CM

## 2018-12-20 NOTE — Telephone Encounter (Signed)
Sent to the pharmacy by e-scribe. 

## 2019-03-29 IMAGING — US US EXTREM  UP VENOUS*L*
1 series · 13 of 24 positions shown · non-contrast
Comparison: None.

CLINICAL DATA: 32-year-old male with left upper extremity heaviness



[Series 1: us extrem up venous*left* · 0.07mm/px · 13 of 37 slices shown]
[im 1/37]
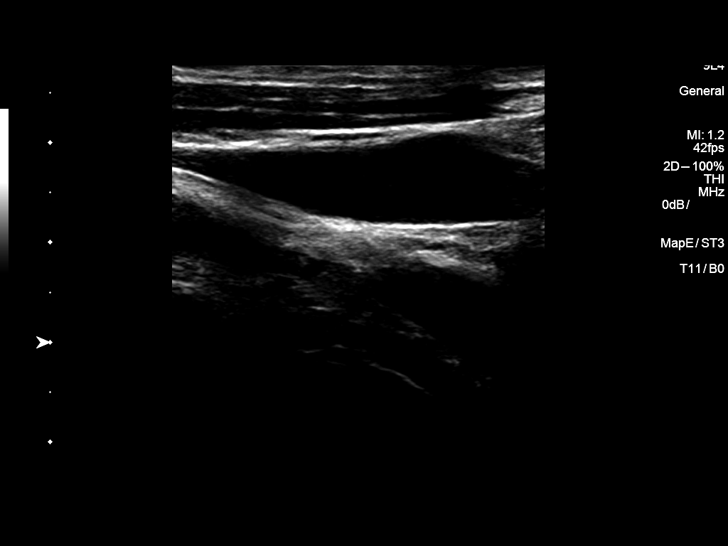
[im 4/37]
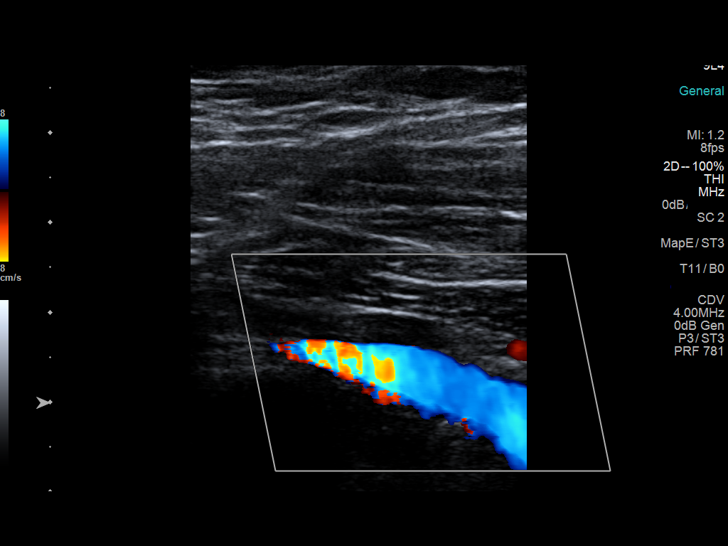
[im 7/37]
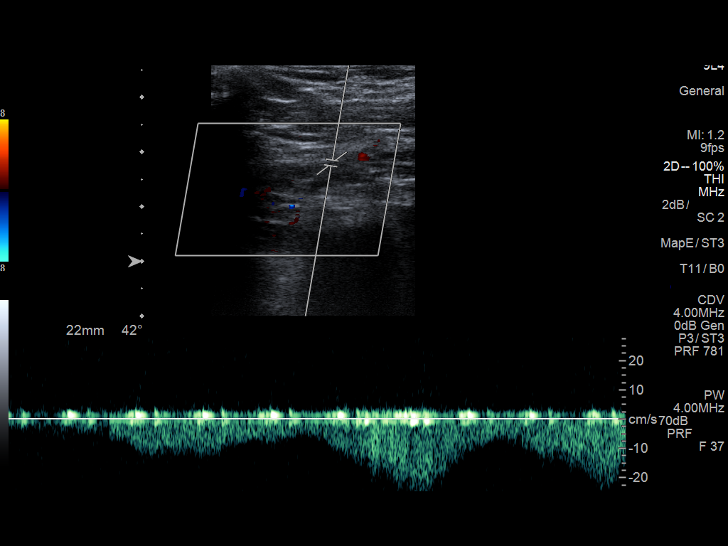
[im 10/37]
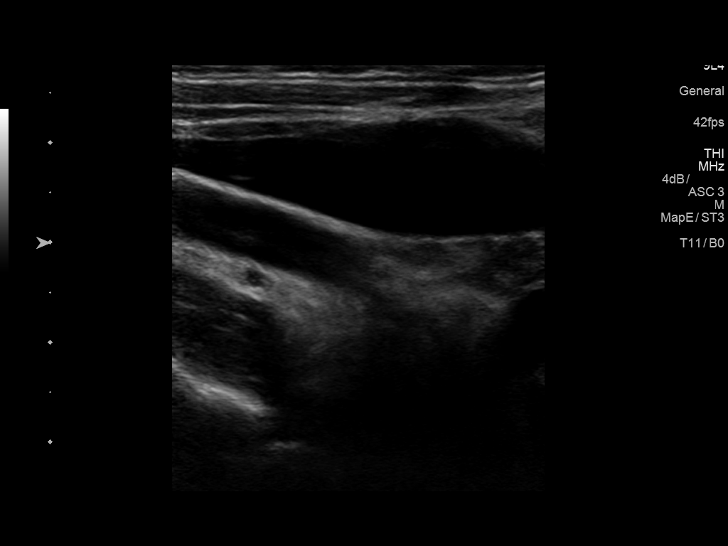
[im 13/37]
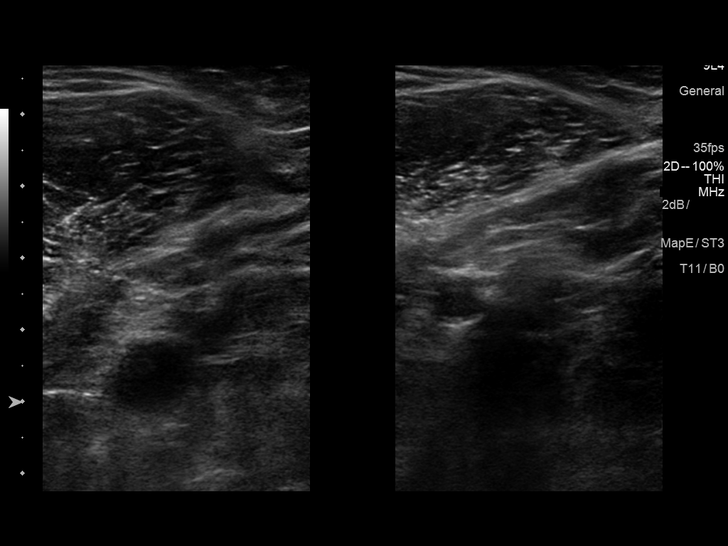
[im 16/37]
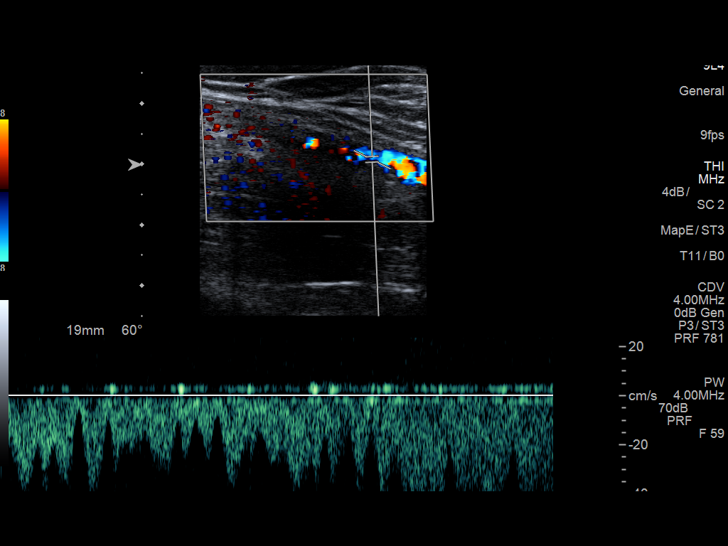
[im 19/37]
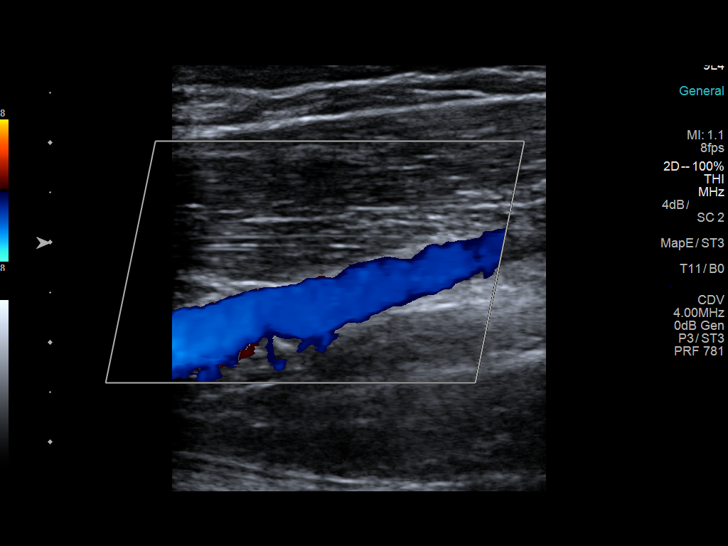
[im 21/37]
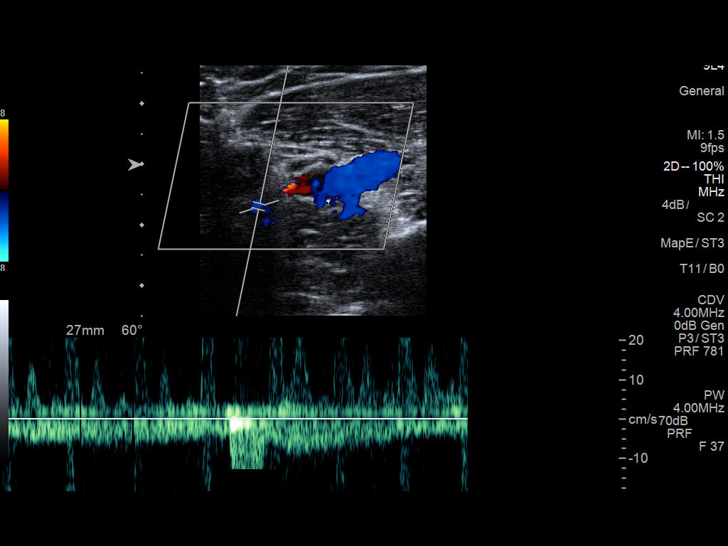
[im 24/37]
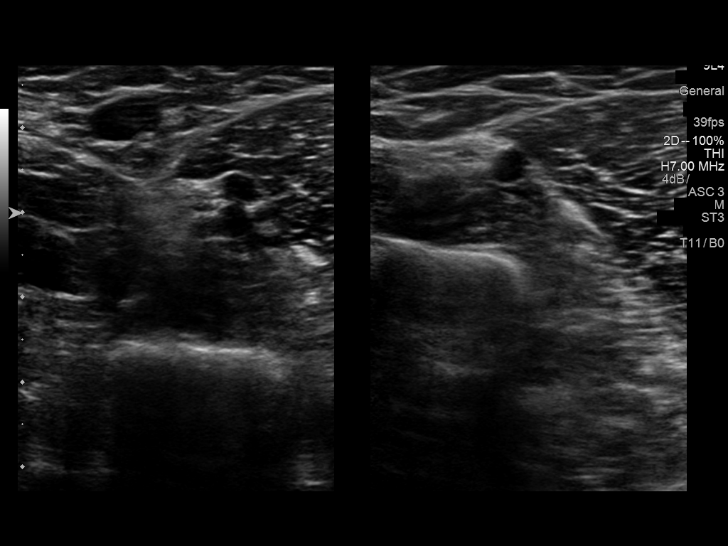
[im 27/37]
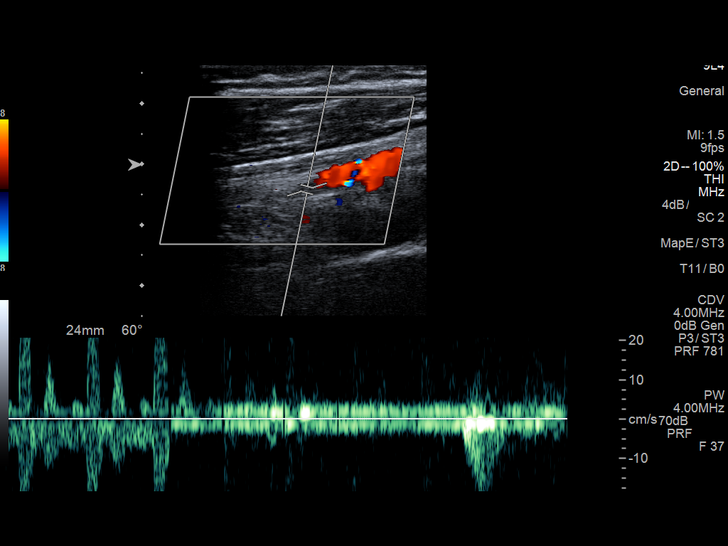
[im 30/37]
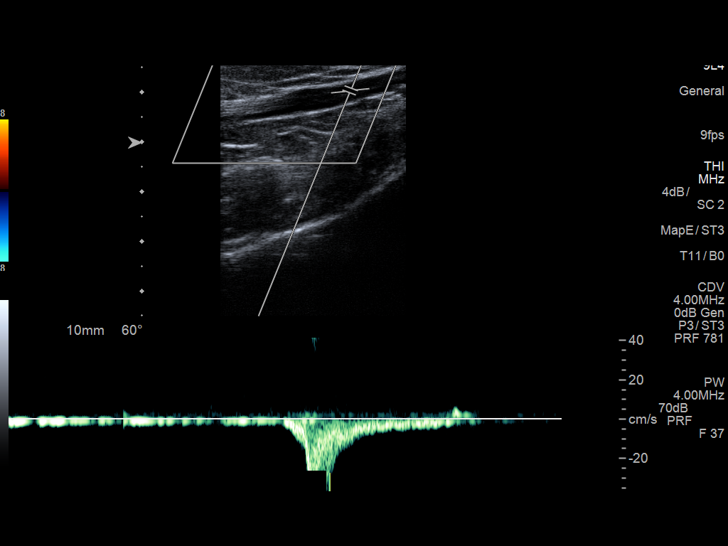
[im 33/37]
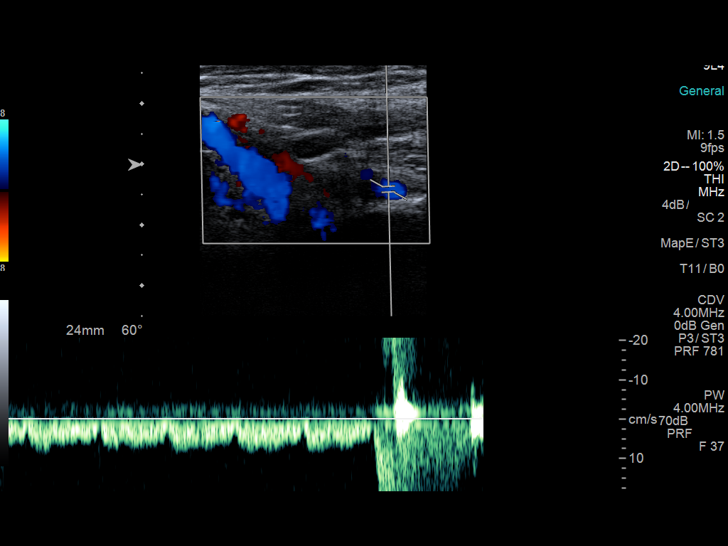
[im 37/37]
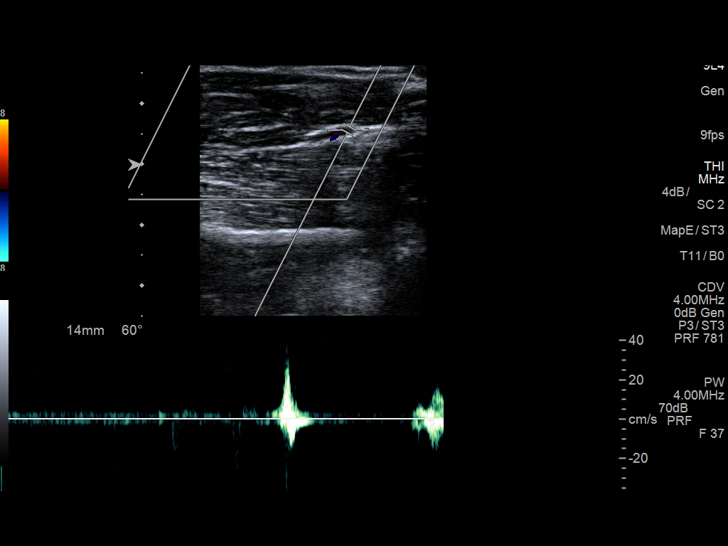

[13 of 24 positions shown; findings below may reference images not displayed]

FINDINGS: Contralateral Subclavian Vein: Respiratory phasicity is normal and
symmetric with the symptomatic side. No evidence of thrombus. Normal
compressibility.

Internal Jugular Vein: No evidence of thrombus. Normal
compressibility, respiratory phasicity and response to augmentation.

Subclavian Vein: No evidence of thrombus. Normal compressibility,
respiratory phasicity and response to augmentation.

Axillary Vein: No evidence of thrombus. Normal compressibility,
respiratory phasicity and response to augmentation.

Cephalic Vein: No evidence of thrombus. Normal compressibility,
respiratory phasicity and response to augmentation.

Basilic Vein: No evidence of thrombus. Normal compressibility,
respiratory phasicity and response to augmentation.

Brachial Veins: No evidence of thrombus. Normal compressibility,
respiratory phasicity and response to augmentation.

Radial Veins: No evidence of thrombus. Normal compressibility,
respiratory phasicity and response to augmentation.

Ulnar Veins: No evidence of thrombus. Normal compressibility,
respiratory phasicity and response to augmentation.

Venous Reflux:  None visualized.

Other Findings:  None visualized.
IMPRESSION: No evidence of DVT within the left upper extremity.

## 2019-05-17 ENCOUNTER — Ambulatory Visit: Payer: BLUE CROSS/BLUE SHIELD

## 2019-06-11 ENCOUNTER — Other Ambulatory Visit: Payer: Self-pay | Admitting: Adult Health

## 2019-06-11 DIAGNOSIS — F419 Anxiety disorder, unspecified: Secondary | ICD-10-CM

## 2019-06-12 DIAGNOSIS — F419 Anxiety disorder, unspecified: Secondary | ICD-10-CM | POA: Insufficient documentation

## 2019-06-12 NOTE — Telephone Encounter (Signed)
Sent to the pharmacy by e-scribe. 

## 2019-07-01 IMAGING — CR DG CHEST 2V
2 series · 2 of 2 positions shown · non-contrast
Comparison: None.

CLINICAL DATA: Palpitations.

EXAM:
CHEST  2 VIEW

[chest pa]
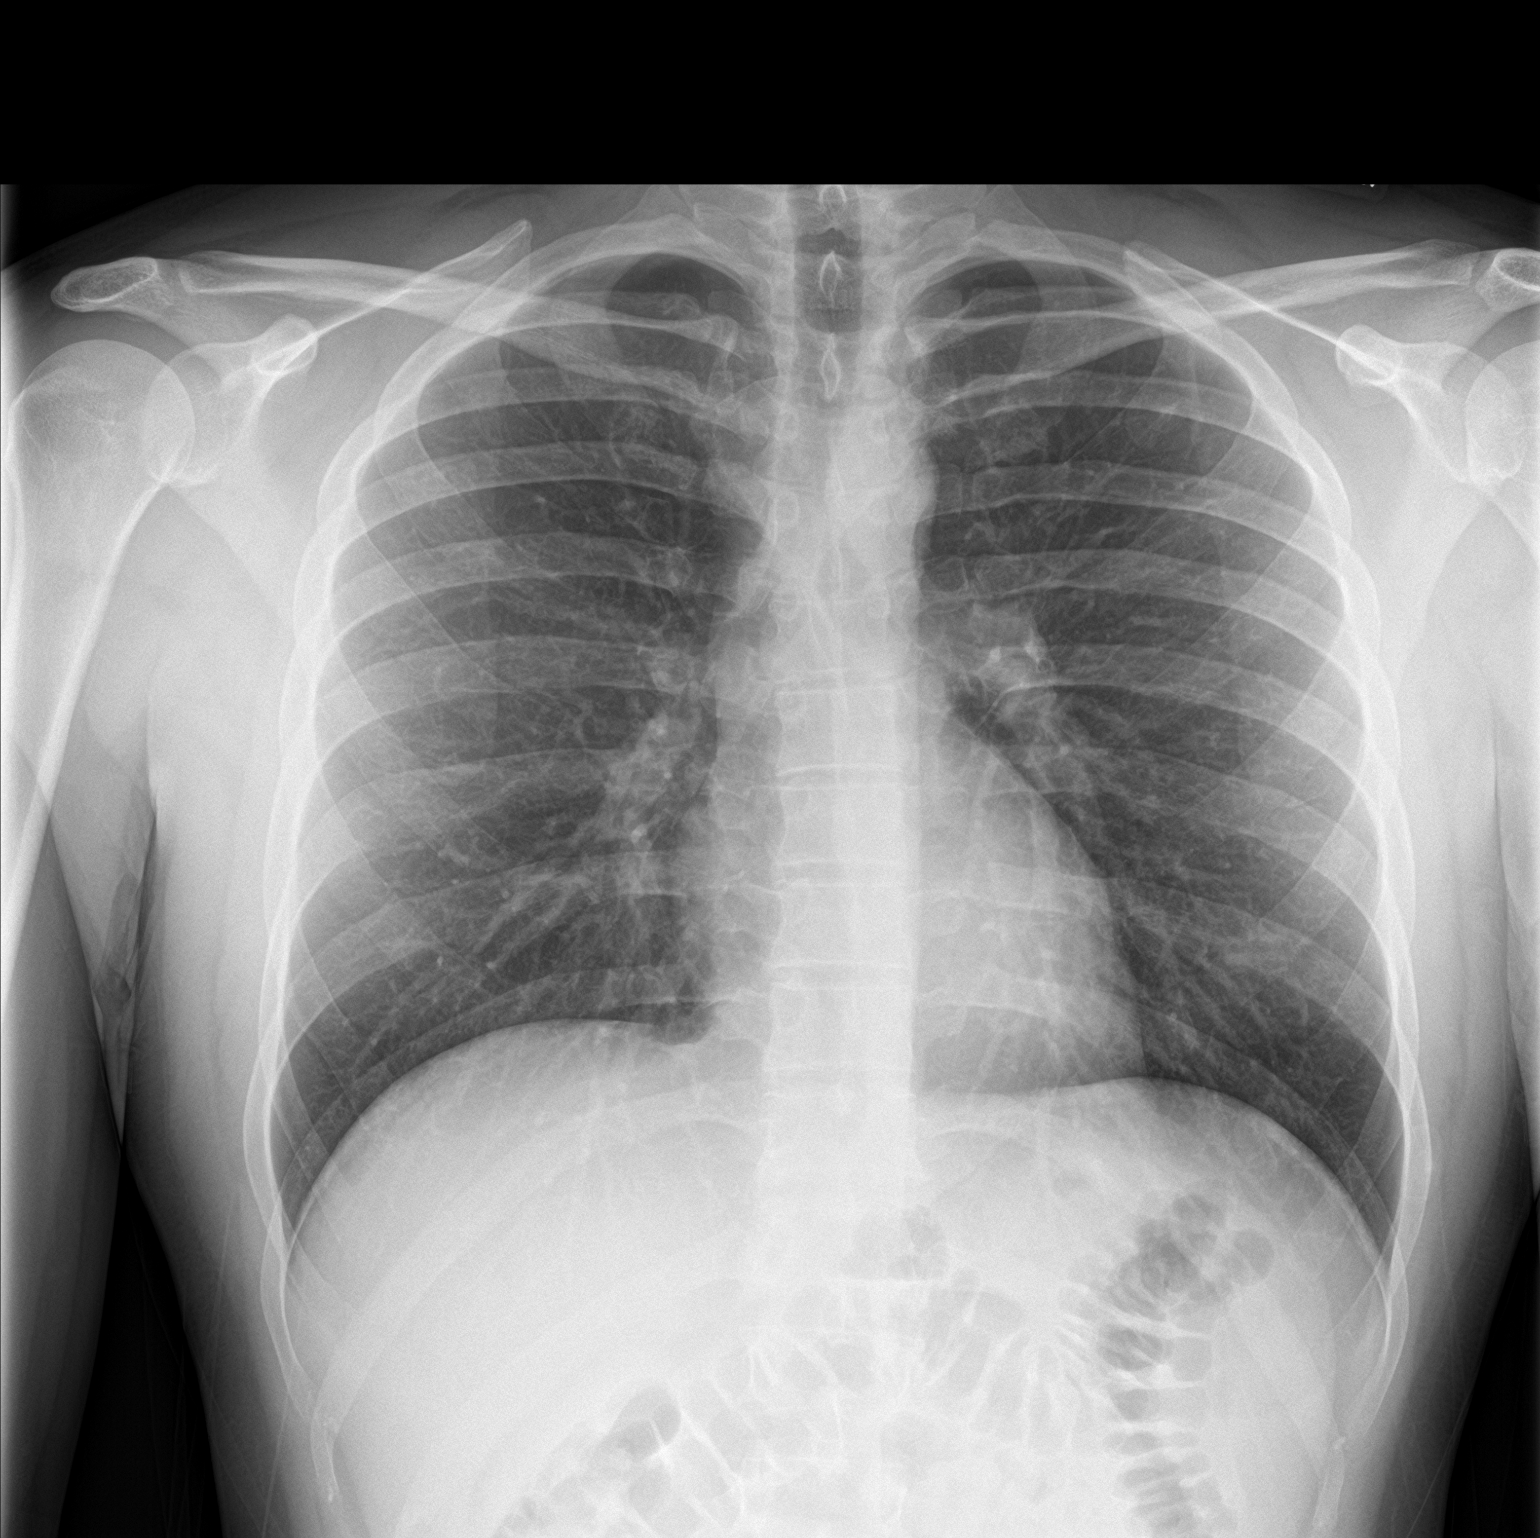

[chest lat]
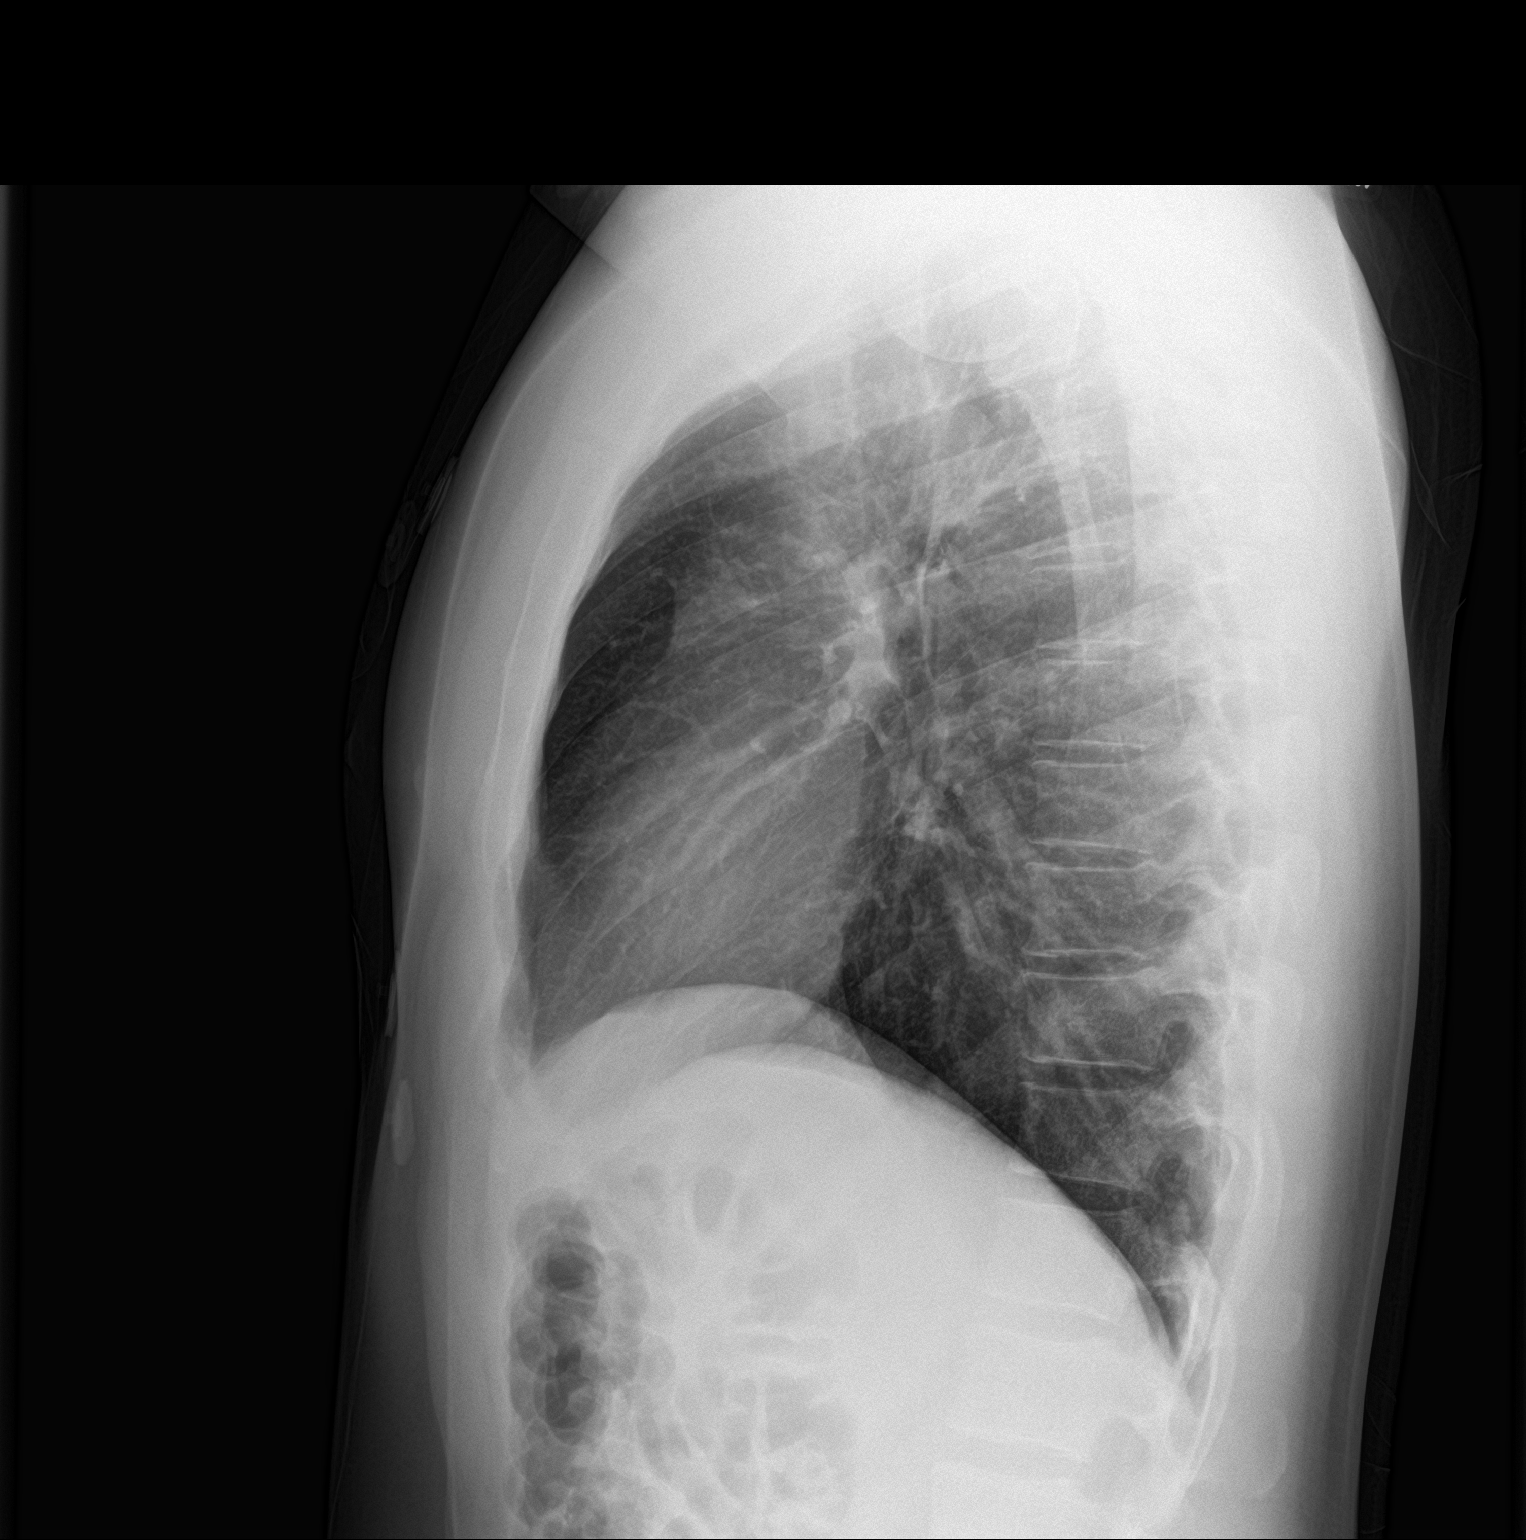

[2 of 2 positions shown; findings below may reference images not displayed]

FINDINGS: The heart size and mediastinal contours are within normal limits.
Both lungs are clear. No pneumothorax or pleural effusion is noted.
The visualized skeletal structures are unremarkable.
IMPRESSION: No active cardiopulmonary disease.

## 2019-07-13 ENCOUNTER — Encounter: Payer: Self-pay | Admitting: Adult Health

## 2019-08-29 ENCOUNTER — Encounter: Payer: Self-pay | Admitting: Adult Health

## 2019-09-05 ENCOUNTER — Other Ambulatory Visit: Payer: Self-pay | Admitting: Adult Health

## 2019-09-05 DIAGNOSIS — F419 Anxiety disorder, unspecified: Secondary | ICD-10-CM

## 2019-09-05 NOTE — Telephone Encounter (Signed)
SENT TO THE PHARMACY BY E-SCRIBE FOR 90 DAYS.  PT HAS UPCOMING CPX ON 11/01/19.

## 2019-11-01 ENCOUNTER — Encounter: Payer: Self-pay | Admitting: Adult Health

## 2019-11-01 NOTE — Progress Notes (Deleted)
Subjective:    Patient ID: Raymond Newman, male    DOB: 1985/07/21, 34 y.o.   MRN: [id number]  HPI Patient presents for yearly preventative medicine examination. He is a pleasant 34 year old male who  has a past medical history of Anxiety, GERD (gastroesophageal reflux disease), Rosacea, and Seasonal allergies.  Anxiety -currently prescribed Celexa 10 mg daily.  He feels as though his symptoms are well controlled on the 10 mg dose.  He also has a prescription for Xanax that he uses very rarely.  All immunizations and health maintenance protocols were reviewed with the patient and needed orders were placed.  Appropriate screening laboratory values were ordered for the patient including screening of hyperlipidemia, renal function and hepatic function. If indicated by BPH, a PSA was ordered.  Medication reconciliation,  past medical history, social history, problem list and allergies were reviewed in detail with the patient  Goals were established with regard to weight loss, exercise, and  diet in compliance with medications  End of life planning was discussed.   Review of Systems  Constitutional: Negative.   HENT: Negative.   Eyes: Negative.   Respiratory: Negative.   Cardiovascular: Negative.   Gastrointestinal: Negative.   Endocrine: Negative.   Genitourinary: Negative.   Musculoskeletal: Negative.   Skin: Negative.   Allergic/Immunologic: Negative.   Neurological: Negative.   Hematological: Negative.   Psychiatric/Behavioral: Negative.   All other systems reviewed and are negative.  Past Medical History:  Diagnosis Date  . Anxiety    with flying  . GERD (gastroesophageal reflux disease)   . Rosacea   . Seasonal allergies     Social History   Socioeconomic History  . Marital status: Married    Spouse name: Raquel Sarna  . Number of children: 0  . Years of education: Not on file  . Highest education level: Not on file  Occupational History  . Occupation: Chef     Comment: [employer]  Tobacco Use  . Smoking status: Never Smoker  . Smokeless tobacco: Never Used  Vaping Use  . Vaping Use: Never used  Substance and Sexual Activity  . Alcohol use: Yes    Alcohol/week: 0.0 standard drinks    Comment: beer daily   . Drug use: No  . Sexual activity: Yes    Partners: Female  Other Topics Concern  . Not on file  Social History Narrative   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[employer]    Has a girlfriend    Just bought a house in December       Social Determinants of Health   Financial Resource Strain:   . Difficulty of Paying Living Expenses: Not on file  Food Insecurity:   . Worried About Charity fundraiser in the Last Year: Not on file  . Ran Out of Food in the Last Year: Not on file  Transportation Needs:   . Lack of Transportation (Medical): Not on file  . Lack of Transportation (Non-Medical): Not on file  Physical Activity:   . Days of Exercise per Week: Not on file  . Minutes of Exercise per Session: Not on file  Stress:   . Feeling of Stress : Not on file  Social Connections:   . Frequency of Communication with Friends and Family: Not on file  . Frequency of Social Gatherings with Friends and Family: Not on file  . Attends Religious Services: Not on file  . Active Member of Clubs or Organizations: Not on  file  . Attends Archivist Meetings: Not on file  . Marital Status: Not on file  Intimate Partner Violence:   . Fear of Current or Ex-Partner: Not on file  . Emotionally Abused: Not on file  . Physically Abused: Not on file  . Sexually Abused: Not on file    Past Surgical History:  Procedure Laterality Date  . 48 HR HOLTER MONITOR  05/2017   Mostly sinus rhythm with sinus tachycardia.  Tachycardia associated with anxiety.  No notable PACs or PVCs noted.  No arrhythmia.  Marland Kitchen ESOPHAGOGASTRODUODENOSCOPY ENDOSCOPY    . GRADUATED EXERCISE TOLERANCE TEST (GXT/ETT)  05/2017   Normal blood pressure response.  No  ischemia.  No symptoms.  LOW RISK. Walked 13.5 minutes.  Achieved heart rate 187 bpm which is 90% max protected.  16.2 MET --minimal PVCs noted prior to and after completion of exercise.  No exertional PVCs.  . TRANSTHORACIC ECHOCARDIOGRAM  05/2017   Normal LV size and function.  EF 60-65%.  No wall motion normality.  Normal diastolic function.  No valve disease.     Family History  Problem Relation Age of Onset  . Hypertension Father   . Hyperlipidemia Father   . Melanoma Mother   . Lupus Mother   . Hypercholesterolemia Paternal Grandfather   . Colon cancer Neg Hx   . Stomach cancer Neg Hx   . Rectal cancer Neg Hx   . Esophageal cancer Neg Hx     No Known Allergies  Current Outpatient Medications on File Prior to Visit  Medication Sig Dispense Refill  . ALPRAZolam (XANAX) 0.5 MG tablet Take 0.5 -1 tab 30 minutes prior to flight 5 tablet 0  . citalopram (CELEXA) 10 MG tablet TAKE 1 TABLET BY MOUTH EVERY DAY 90 tablet 0  . Flaxseed, Linseed, (FLAXSEED OIL PO) Take by mouth.    . metroNIDAZOLE (METROGEL) 0.75 % gel APPLY TO THE AFFECTED SKIN ON THE FACE TWICE DAILY.  0  . Multiple Vitamin (ONE-A-DAY MENS PO) Take 1 tablet by mouth daily.    . Omega-3 Fatty Acids (FISH OIL) 1000 MG CAPS Take by mouth.    Marland Kitchen omeprazole (PRILOSEC OTC) 20 MG tablet Take 20 mg by mouth daily.     Current Facility-Administered Medications on File Prior to Visit  Medication Dose Route Frequency Provider Last Rate Last Admin  . 0.9 %  sodium chloride infusion  500 mL Intravenous Once Ladene Artist, MD        There were no vitals taken for this visit.      Objective:   Physical Exam Vitals and nursing note reviewed.  Constitutional:      General: He is not in acute distress.    Appearance: Normal appearance. He is well-developed and normal weight.  HENT:     Head: Normocephalic and atraumatic.     Right Ear: Tympanic membrane, ear canal and external ear normal. There is no impacted cerumen.      Left Ear: Tympanic membrane, ear canal and external ear normal. There is no impacted cerumen.     Nose: Nose normal. No congestion or rhinorrhea.     Mouth/Throat:     Mouth: Mucous membranes are moist.     Pharynx: Oropharynx is clear. No oropharyngeal exudate or posterior oropharyngeal erythema.  Eyes:     General:        Right eye: No discharge.        Left eye: No discharge.  Extraocular Movements: Extraocular movements intact.     Conjunctiva/sclera: Conjunctivae normal.     Pupils: Pupils are equal, round, and reactive to light.  Neck:     Vascular: No carotid bruit.     Trachea: No tracheal deviation.  Cardiovascular:     Rate and Rhythm: Normal rate and regular rhythm.     Pulses: Normal pulses.     Heart sounds: Normal heart sounds. No murmur heard.  No friction rub. No gallop.   Pulmonary:     Effort: Pulmonary effort is normal. No respiratory distress.     Breath sounds: Normal breath sounds. No stridor. No wheezing, rhonchi or rales.  Chest:     Chest wall: No tenderness.  Abdominal:     General: Bowel sounds are normal. There is no distension.     Palpations: Abdomen is soft. There is no mass.     Tenderness: There is no abdominal tenderness. There is no right CVA tenderness, left CVA tenderness, guarding or rebound.     Hernia: No hernia is present.  Musculoskeletal:        General: No swelling, tenderness, deformity or signs of injury. Normal range of motion.     Right lower leg: No edema.     Left lower leg: No edema.  Lymphadenopathy:     Cervical: No cervical adenopathy.  Skin:    General: Skin is warm and dry.     Capillary Refill: Capillary refill takes less than 2 seconds.     Coloration: Skin is not jaundiced or pale.     Findings: No bruising, erythema, lesion or rash.  Neurological:     General: No focal deficit present.     Mental Status: He is alert and oriented to person, place, and time.     Cranial Nerves: No cranial nerve deficit.      Sensory: No sensory deficit.     Motor: No weakness.     Coordination: Coordination normal.     Gait: Gait normal.     Deep Tendon Reflexes: Reflexes normal.  Psychiatric:        Mood and Affect: Mood normal.        Behavior: Behavior normal.        Thought Content: Thought content normal.        Judgment: Judgment normal.       Assessment & Plan:

## 2019-12-15 ENCOUNTER — Other Ambulatory Visit: Payer: Self-pay | Admitting: Adult Health

## 2019-12-15 DIAGNOSIS — F419 Anxiety disorder, unspecified: Secondary | ICD-10-CM

## 2020-01-10 ENCOUNTER — Encounter: Payer: Managed Care, Other (non HMO) | Admitting: Adult Health

## 2020-01-16 ENCOUNTER — Encounter: Payer: Managed Care, Other (non HMO) | Admitting: Adult Health

## 2020-03-10 ENCOUNTER — Other Ambulatory Visit: Payer: Self-pay | Admitting: Adult Health

## 2020-03-10 DIAGNOSIS — F419 Anxiety disorder, unspecified: Secondary | ICD-10-CM

## 2020-03-11 NOTE — Telephone Encounter (Signed)
Pt has an appt on 03/13/20

## 2020-03-12 ENCOUNTER — Other Ambulatory Visit: Payer: Self-pay

## 2020-03-13 ENCOUNTER — Encounter: Payer: Self-pay | Admitting: Adult Health

## 2020-03-13 ENCOUNTER — Ambulatory Visit (INDEPENDENT_AMBULATORY_CARE_PROVIDER_SITE_OTHER): Payer: Managed Care, Other (non HMO) | Admitting: Adult Health

## 2020-03-13 VITALS — BP 150/90 | Temp 98.6°F | Ht 68.0 in | Wt 208.0 lb

## 2020-03-13 DIAGNOSIS — R030 Elevated blood-pressure reading, without diagnosis of hypertension: Secondary | ICD-10-CM | POA: Diagnosis not present

## 2020-03-13 DIAGNOSIS — F419 Anxiety disorder, unspecified: Secondary | ICD-10-CM | POA: Diagnosis not present

## 2020-03-13 DIAGNOSIS — Z0000 Encounter for general adult medical examination without abnormal findings: Secondary | ICD-10-CM

## 2020-03-13 LAB — COMPREHENSIVE METABOLIC PANEL
ALT: 80 U/L — ABNORMAL HIGH (ref 0–53)
AST: 40 U/L — ABNORMAL HIGH (ref 0–37)
Albumin: 4.8 g/dL (ref 3.5–5.2)
Alkaline Phosphatase: 91 U/L (ref 39–117)
BUN: 17 mg/dL (ref 6–23)
CO2: 28 mEq/L (ref 19–32)
Calcium: 10.1 mg/dL (ref 8.4–10.5)
Chloride: 99 mEq/L (ref 96–112)
Creatinine, Ser: 1.05 mg/dL (ref 0.40–1.50)
GFR: 91.98 mL/min (ref >60.00)
Glucose, Bld: 96 mg/dL (ref 70–99)
Potassium: 4.2 mEq/L (ref 3.5–5.1)
Sodium: 138 mEq/L (ref 135–145)
Total Bilirubin: 1.2 mg/dL (ref 0.2–1.2)
Total Protein: 7.2 g/dL (ref 6.0–8.3)

## 2020-03-13 LAB — CBC WITH DIFFERENTIAL/PLATELET
Basophils Absolute: 0.1 10*3/uL (ref 0.0–0.1)
Basophils Relative: 0.8 % (ref 0.0–3.0)
Eosinophils Absolute: 0.2 10*3/uL (ref 0.0–0.7)
Eosinophils Relative: 2 % (ref 0.0–5.0)
HCT: 43.3 % (ref 39.0–52.0)
Hemoglobin: 15.1 g/dL (ref 13.0–17.0)
Lymphocytes Relative: 24.6 % (ref 12.0–46.0)
Lymphs Abs: 1.9 10*3/uL (ref 0.7–4.0)
MCHC: 35 g/dL (ref 30.0–36.0)
MCV: 88.1 fl (ref 78.0–100.0)
Monocytes Absolute: 0.7 10*3/uL (ref 0.1–1.0)
Monocytes Relative: 8.6 % (ref 3.0–12.0)
Neutro Abs: 4.9 10*3/uL (ref 1.4–7.7)
Neutrophils Relative %: 64 % (ref 43.0–77.0)
Platelets: 266 10*3/uL (ref 150.0–400.0)
RBC: 4.92 Mil/uL (ref 4.22–5.81)
RDW: 12.6 % (ref 11.5–15.5)
WBC: 7.7 10*3/uL (ref 4.0–10.5)

## 2020-03-13 LAB — LIPID PANEL
Cholesterol: 287 mg/dL — ABNORMAL HIGH (ref 0–200)
HDL: 41.7 mg/dL (ref >39.00)
NonHDL: 245.24
Total CHOL/HDL Ratio: 7
Triglycerides: 330 mg/dL — ABNORMAL HIGH (ref 0.0–149.0)
VLDL: 66 mg/dL — ABNORMAL HIGH (ref 0.0–40.0)

## 2020-03-13 LAB — HEMOGLOBIN A1C: Hgb A1c MFr Bld: 5.3 % (ref 4.6–6.5)

## 2020-03-13 LAB — TSH: TSH: 2.33 u[IU]/mL (ref 0.35–4.50)

## 2020-03-13 LAB — LDL CHOLESTEROL, DIRECT: Direct LDL: 172 mg/dL

## 2020-03-13 MED ORDER — ALPRAZOLAM 0.5 MG PO TABS
ORAL_TABLET | ORAL | 0 refills | Status: DC
Start: 2020-03-13 — End: 2021-03-25

## 2020-03-13 NOTE — Progress Notes (Signed)
Subjective:    Patient ID: Raymond Newman, male    DOB: March 17, 1985, 35 y.o.   MRN: [id number]  HPI  Patient presents for yearly preventative medicine examination. He is a pleasant 35 year old male who  has a past medical history of Anxiety, GERD (gastroesophageal reflux disease), Rosacea, and Seasonal allergies.  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   Anxiety - takes celexa 10 mg daily. He feels as though he is well controlled on this dose. Takes xanax for flying   Elevated Blood pressure readings - does not monitor at home. Reports anxiety coming to the medical office.  BP Readings from Last 3 Encounters:  03/13/20 (!) 150/90  06/29/18 (!) 160/90  10/21/17 118/80   All immunizations and health maintenance protocols were reviewed with the patient and needed orders were placed.  Appropriate screening laboratory values were ordered for the patient including screening of hyperlipidemia, renal function and hepatic function.  Medication reconciliation,  past medical history, social history, problem list and allergies were reviewed in detail with the patient  Goals were established with regard to weight loss, exercise, and  diet in compliance with medications. Is not exercising, tries to eat somewhat healthy?   Wt Readings from Last 3 Encounters:  03/13/20 208 lb (94.3 kg)  06/29/18 196 lb (88.9 kg)  10/21/17 178 lb (80.7 kg)    Review of Systems  Constitutional: Negative.   HENT: Negative.   Eyes: Negative.   Respiratory: Negative.   Cardiovascular: Negative.   Gastrointestinal: Negative.   Endocrine: Negative.   Genitourinary: Negative.   Musculoskeletal: Negative.   Skin: Negative.   Allergic/Immunologic: Negative.   Neurological: Negative.   Hematological: Negative.   Psychiatric/Behavioral: Negative.   All other systems reviewed and are negative.  Past Medical History:  Diagnosis Date  . Anxiety    with flying  . GERD  (gastroesophageal reflux disease)   . Rosacea   . Seasonal allergies     Social History   Socioeconomic History  . Marital status: Married    Spouse name: Raquel Sarna  . Number of children: 0  . Years of education: Not on file  . Highest education level: Not on file  Occupational History  . Occupation: Chef    Comment: [employer]  Tobacco Use  . Smoking status: Never Smoker  . Smokeless tobacco: Never Used  Vaping Use  . Vaping Use: Never used  Substance and Sexual Activity  . Alcohol use: Yes    Alcohol/week: 0.0 standard drinks    Comment: beer daily   . Drug use: No  . Sexual activity: Yes    Partners: Female  Other Topics Concern  . 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 48 HR HOLTER MONITOR  05/2017   Mostly sinus rhythm with sinus tachycardia.  Tachycardia associated with anxiety.  No notable PACs or PVCs noted.  No arrhythmia.  Marland Kitchen ESOPHAGOGASTRODUODENOSCOPY ENDOSCOPY    .  GRADUATED EXERCISE TOLERANCE TEST (GXT/ETT)  05/2017   Normal blood pressure response.  No ischemia.  No symptoms.  LOW RISK. Walked 13.5 minutes.  Achieved heart rate 187 bpm which is 90% max protected.  16.2 MET --minimal PVCs noted prior to and after completion of exercise.  No exertional PVCs.  . TRANSTHORACIC ECHOCARDIOGRAM  05/2017   Normal LV size and function.  EF 60-65%.  No wall motion normality.  Normal diastolic function.  No valve disease.     Family History  Problem Relation Age of Onset  . Hypertension Father   . Hyperlipidemia Father   . Melanoma  Mother   . Lupus Mother   . Hypercholesterolemia Paternal Grandfather   . Colon cancer Neg Hx   . Stomach cancer Neg Hx   . Rectal cancer Neg Hx   . Esophageal cancer Neg Hx     No Known Allergies  Current Outpatient Medications on File Prior to Visit  Medication Sig Dispense Refill  . ALPRAZolam (XANAX) 0.5 MG tablet Take 0.5 -1 tab 30 minutes prior to flight 5 tablet 0  . metroNIDAZOLE (METROGEL) 0.75 % gel APPLY TO THE AFFECTED SKIN ON THE FACE TWICE DAILY.  0  . Multiple Vitamin (ONE-A-DAY MENS PO) Take 1 tablet by mouth daily.    . Omega-3 Fatty Acids (FISH OIL) 1000 MG CAPS Take by mouth.    Marland Kitchen omeprazole (PRILOSEC OTC) 20 MG tablet Take 20 mg by mouth daily.    . citalopram (CELEXA) 10 MG tablet TAKE 1 TABLET BY MOUTH EVERY DAY 90 tablet 1   Current Facility-Administered Medications on File Prior to Visit  Medication Dose Route Frequency Provider Last Rate Last Admin  . 0.9 %  sodium chloride infusion  500 mL Intravenous Once Lucio Edward T, MD        BP (!) 150/90   Temp 98.6 F (37 C) (Oral)   Ht 5' 8"  (1.727 m) Comment: WITHOUT SHOES  Wt 208 lb (94.3 kg)   BMI 31.63 kg/m       Objective:   Physical Exam Vitals and nursing note reviewed.  Constitutional:      General: He is not in acute distress.    Appearance: Normal appearance. He is well-developed, well-groomed and overweight.  HENT:     Head: Normocephalic and atraumatic.     Right Ear: Tympanic membrane, ear canal and external ear normal. There is no impacted cerumen.     Left Ear: Tympanic membrane, ear canal and external ear normal. There is no impacted cerumen.     Nose: Nose normal. No congestion or rhinorrhea.     Mouth/Throat:     Mouth: Mucous membranes are moist.     Pharynx: Oropharynx is clear. No oropharyngeal exudate or posterior oropharyngeal erythema.  Eyes:     General:        Right eye: No discharge.        Left eye: No discharge.     Extraocular Movements: Extraocular movements  intact.     Conjunctiva/sclera: Conjunctivae normal.     Pupils: Pupils are equal, round, and reactive to light.  Neck:     Vascular: No carotid bruit.     Trachea: No tracheal deviation.  Cardiovascular:     Rate and Rhythm: Normal rate and regular rhythm.     Pulses: Normal pulses.     Heart sounds: Normal heart sounds. No murmur heard. No friction rub. No gallop.   Pulmonary:     Effort: Pulmonary  effort is normal. No respiratory distress.     Breath sounds: Normal breath sounds. No stridor. No wheezing, rhonchi or rales.  Chest:     Chest wall: No tenderness.  Abdominal:     General: Bowel sounds are normal. There is no distension.     Palpations: Abdomen is soft. There is no mass.     Tenderness: There is no abdominal tenderness. There is no right CVA tenderness, left CVA tenderness, guarding or rebound.     Hernia: No hernia is present.  Musculoskeletal:        General: No swelling, tenderness, deformity or signs of injury. Normal range of motion.     Right lower leg: No edema.     Left lower leg: No edema.  Lymphadenopathy:     Cervical: No cervical adenopathy.  Skin:    General: Skin is warm and dry.     Capillary Refill: Capillary refill takes less than 2 seconds.     Coloration: Skin is not jaundiced or pale.     Findings: No bruising, erythema, lesion or rash.  Neurological:     General: No focal deficit present.     Mental Status: He is alert and oriented to person, place, and time.     Cranial Nerves: No cranial nerve deficit.     Sensory: No sensory deficit.     Motor: No weakness.     Coordination: Coordination normal.     Gait: Gait normal.     Deep Tendon Reflexes: Reflexes normal.  Psychiatric:        Attention and Perception: Attention and perception normal.        Mood and Affect: Mood is anxious.        Speech: Speech normal.        Behavior: Behavior normal.        Thought Content: Thought content normal.        Judgment: Judgment normal.            Assessment & Plan:  1. Routine general medical examination at a health care facility - weight continues to climb. Needs to find time to start exercising + heart healthy diet  - Follow up in one year or sooner if needed - Follow up in one year or sooner if needed - CBC with Differential/Platelet; Future - Comprehensive metabolic panel; Future - Hemoglobin A1c; Future - Lipid panel; Future - TSH; Future  2. Anxiety - Continue with Celexa 10 mg  - ALPRAZolam (XANAX) 0.5 MG tablet; Take 0.5 -1 tab 30 minutes prior to flight  Dispense: 10 tablet; Refill: 0  3. Elevated blood pressure reading - Will have him monitor BP at home and report back via mychart in one week  - Needs to start working on weight loss through diet and exercise    Dorothyann Peng, NP

## 2020-03-13 NOTE — Addendum Note (Signed)
Addended by: Lerry Liner on: 03/13/2020 01:47 PM   Modules accepted: Orders

## 2020-03-13 NOTE — Telephone Encounter (Signed)
Sent to the pharmacy by e-scribe. 

## 2020-03-14 ENCOUNTER — Telehealth: Payer: Self-pay | Admitting: Adult Health

## 2020-03-14 NOTE — Telephone Encounter (Signed)
Updated patient on his labs  Lab Results  Component Value Date   CHOL 287 (H) 03/13/2020   HDL 41.70 03/13/2020   LDLCALC 147 (H) 06/29/2018   LDLDIRECT 172.0 03/13/2020   TRIG 330.0 (H) 03/13/2020   CHOLHDL 7 03/13/2020   Will have him work on lifestyle modifications and follow up in 6 months for retest

## 2020-09-10 ENCOUNTER — Other Ambulatory Visit: Payer: Self-pay | Admitting: Adult Health

## 2020-09-10 DIAGNOSIS — F419 Anxiety disorder, unspecified: Secondary | ICD-10-CM

## 2021-03-09 ENCOUNTER — Other Ambulatory Visit: Payer: Self-pay | Admitting: Adult Health

## 2021-03-09 DIAGNOSIS — F419 Anxiety disorder, unspecified: Secondary | ICD-10-CM

## 2021-03-12 ENCOUNTER — Other Ambulatory Visit: Payer: Self-pay | Admitting: Adult Health

## 2021-03-12 ENCOUNTER — Telehealth: Payer: Self-pay | Admitting: Adult Health

## 2021-03-12 DIAGNOSIS — F419 Anxiety disorder, unspecified: Secondary | ICD-10-CM

## 2021-03-12 NOTE — Telephone Encounter (Signed)
Pt has sch cpe on 03-25-2021 and would like a refill on citalopram (CELEXA) 10 MG   [id number] [place], [place] - [address] Phone:  [phone]  Fax:  [phone]

## 2021-03-13 ENCOUNTER — Other Ambulatory Visit: Payer: Self-pay | Admitting: Adult Health

## 2021-03-13 DIAGNOSIS — F419 Anxiety disorder, unspecified: Secondary | ICD-10-CM

## 2021-03-13 MED ORDER — CITALOPRAM HYDROBROMIDE 10 MG PO TABS: 10.0000 mg | ORAL_TABLET | Freq: Every day | ORAL | 0 refills | Status: DC

## 2021-03-13 NOTE — Telephone Encounter (Signed)
Medication has been sent in for 30 day

## 2021-03-25 ENCOUNTER — Encounter: Payer: Self-pay | Admitting: Adult Health

## 2021-03-25 ENCOUNTER — Ambulatory Visit (INDEPENDENT_AMBULATORY_CARE_PROVIDER_SITE_OTHER): Payer: Managed Care, Other (non HMO) | Admitting: Adult Health

## 2021-03-25 VITALS — BP 130/80 | HR 89 | Temp 98.2°F | Ht 68.25 in | Wt 200.0 lb

## 2021-03-25 DIAGNOSIS — F419 Anxiety disorder, unspecified: Secondary | ICD-10-CM | POA: Diagnosis not present

## 2021-03-25 DIAGNOSIS — E782 Mixed hyperlipidemia: Secondary | ICD-10-CM

## 2021-03-25 DIAGNOSIS — Z0000 Encounter for general adult medical examination without abnormal findings: Secondary | ICD-10-CM

## 2021-03-25 LAB — CBC WITH DIFFERENTIAL/PLATELET
Basophils Absolute: 0.1 10*3/uL (ref 0.0–0.1)
Basophils Relative: 1.1 % (ref 0.0–3.0)
Eosinophils Absolute: 0.1 10*3/uL (ref 0.0–0.7)
Eosinophils Relative: 1.9 % (ref 0.0–5.0)
HCT: 43.4 % (ref 39.0–52.0)
Hemoglobin: 14.9 g/dL (ref 13.0–17.0)
Lymphocytes Relative: 22.9 % (ref 12.0–46.0)
Lymphs Abs: 1.5 10*3/uL (ref 0.7–4.0)
MCHC: 34.4 g/dL (ref 30.0–36.0)
MCV: 87.6 fl (ref 78.0–100.0)
Monocytes Absolute: 0.6 10*3/uL (ref 0.1–1.0)
Monocytes Relative: 9.5 % (ref 3.0–12.0)
Neutro Abs: 4.3 10*3/uL (ref 1.4–7.7)
Neutrophils Relative %: 64.6 % (ref 43.0–77.0)
Platelets: 261 10*3/uL (ref 150.0–400.0)
RBC: 4.96 Mil/uL (ref 4.22–5.81)
RDW: 12.2 % (ref 11.5–15.5)
WBC: 6.6 10*3/uL (ref 4.0–10.5)

## 2021-03-25 LAB — LIPID PANEL
Cholesterol: 240 mg/dL — ABNORMAL HIGH (ref 0–200)
HDL: 33.9 mg/dL — ABNORMAL LOW (ref >39.00)
NonHDL: 205.76
Total CHOL/HDL Ratio: 7
Triglycerides: 218 mg/dL — ABNORMAL HIGH (ref 0.0–149.0)
VLDL: 43.6 mg/dL — ABNORMAL HIGH (ref 0.0–40.0)

## 2021-03-25 LAB — COMPREHENSIVE METABOLIC PANEL
ALT: 35 U/L (ref 0–53)
AST: 24 U/L (ref 0–37)
Albumin: 4.7 g/dL (ref 3.5–5.2)
Alkaline Phosphatase: 70 U/L (ref 39–117)
BUN: 12 mg/dL (ref 6–23)
CO2: 32 mEq/L (ref 19–32)
Calcium: 9.7 mg/dL (ref 8.4–10.5)
Chloride: 103 mEq/L (ref 96–112)
Creatinine, Ser: 1.07 mg/dL (ref 0.40–1.50)
GFR: 89.27 mL/min (ref >60.00)
Glucose, Bld: 96 mg/dL (ref 70–99)
Potassium: 4.3 mEq/L (ref 3.5–5.1)
Sodium: 139 mEq/L (ref 135–145)
Total Bilirubin: 1 mg/dL (ref 0.2–1.2)
Total Protein: 7.1 g/dL (ref 6.0–8.3)

## 2021-03-25 LAB — TSH: TSH: 1.27 u[IU]/mL (ref 0.35–5.50)

## 2021-03-25 LAB — LDL CHOLESTEROL, DIRECT: Direct LDL: 168 mg/dL

## 2021-03-25 MED ORDER — ALPRAZOLAM 0.5 MG PO TABS: ORAL_TABLET | ORAL | 0 refills | Status: DC

## 2021-03-25 MED ORDER — CITALOPRAM HYDROBROMIDE 10 MG PO TABS: 10.0000 mg | ORAL_TABLET | Freq: Every day | ORAL | 1 refills | Status: DC

## 2021-03-25 NOTE — Patient Instructions (Addendum)
It was great seeing you today   We will follow up with you regarding your lab work   Please let me know if you need anything   [email or web address]

## 2021-03-25 NOTE — Progress Notes (Signed)
Subjective:    Patient ID: Raymond Newman, male    DOB: 07/23/1985, 36 y.o.   MRN: [id number]  HPI Patient presents for yearly preventative medicine examination. He is a pleasant 36 year old male who  has a past medical history of Anxiety, GERD (gastroesophageal reflux disease), Rosacea, and Seasonal allergies.  Anxiety -takes Celexa 10 mg daily.  No controlled symptoms well.  Will take Xanax PRN for flying.   Hyperlipidemia -not currently on medication.  Wanted to work on lifestyle modifications first. He has stopped drinking alcohol and has been able to lose about 8 pounds.   Lab Results  Component Value Date   CHOL 287 (H) 03/13/2020   HDL 41.70 03/13/2020   LDLCALC 147 (H) 06/29/2018   LDLDIRECT 172.0 03/13/2020   TRIG 330.0 (H) 03/13/2020   CHOLHDL 7 03/13/2020   All immunizations and health maintenance protocols were reviewed with the patient and needed orders were placed.  Appropriate screening laboratory values were ordered for the patient including screening of hyperlipidemia, renal function and hepatic function.  Medication reconciliation,  past medical history, social history, problem list and allergies were reviewed in detail with the patient  Goals were established with regard to weight loss, exercise, and  diet in compliance with medications. Has just started exercising and is working on a heart healthy diet.  Wt Readings from Last 3 Encounters:  03/25/21 200 lb (90.7 kg)  03/13/20 208 lb (94.3 kg)  06/29/18 196 lb (88.9 kg)    Review of Systems  Constitutional: Negative.   HENT: Negative.    Eyes: Negative.   Respiratory: Negative.    Cardiovascular: Negative.   Gastrointestinal: Negative.   Endocrine: Negative.   Genitourinary: Negative.   Musculoskeletal: Negative.   Skin: Negative.   Allergic/Immunologic: Negative.   Neurological: Negative.   Hematological: Negative.   Psychiatric/Behavioral: Negative.    All other systems reviewed and are  negative. Past Medical History:  Diagnosis Date   Anxiety    with flying   GERD (gastroesophageal reflux disease)    Rosacea    Seasonal allergies     Social History   Socioeconomic History   Marital status: Married    Spouse name: Raquel Sarna   Number of children: 0   Years of education: Not on file   Highest education level: Not on file  Occupational History   Occupation: Chef    Comment: [employer]  Tobacco Use   Smoking status: Never   Smokeless tobacco: Never  Vaping Use   Vaping Use: Never used  Substance and Sexual Activity   Alcohol use: Yes    Alcohol/week: 0.0 standard drinks    Comment: beer daily    Drug use: No   Sexual activity: Yes    Partners: Female  Other Topics Concern   Not on file  Social History Narrative   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[employer]    Has a girlfriend    Just bought a house in December       Social Determinants of Health   Financial Resource Strain: Not on file  Food Insecurity: Not on file  Transportation Needs: Not on file  Physical Activity: Not on file  Stress: Not on file  Social Connections: Not on file  Intimate Partner Violence: Not on file    Past Surgical History:  Procedure Laterality Date   48 HR HOLTER MONITOR  05/2017   Mostly sinus rhythm with sinus tachycardia.  Tachycardia associated with anxiety.  No notable PACs or PVCs noted.  No arrhythmia.   ESOPHAGOGASTRODUODENOSCOPY ENDOSCOPY     GRADUATED EXERCISE TOLERANCE TEST (GXT/ETT)  05/2017   Normal blood pressure response.  No ischemia.  No symptoms.  LOW RISK. Walked 13.5 minutes.  Achieved heart rate 187 bpm which is 90% max protected.  16.2 MET --minimal PVCs noted prior to and after completion of exercise.  No exertional PVCs.   TRANSTHORACIC ECHOCARDIOGRAM  05/2017   Normal LV size and function.  EF 60-65%.  No wall motion normality.  Normal diastolic function.  No valve disease.     Family History  Problem Relation Age of Onset    Hypertension Father    Hyperlipidemia Father    Melanoma Mother    Lupus Mother    Hypercholesterolemia Paternal Grandfather    Colon cancer Neg Hx    Stomach cancer Neg Hx    Rectal cancer Neg Hx    Esophageal cancer Neg Hx     No Known Allergies  Current Outpatient Medications on File Prior to Visit  Medication Sig Dispense Refill   ALPRAZolam (XANAX) 0.5 MG tablet Take 0.5 -1 tab 30 minutes prior to flight 10 tablet 0   citalopram (CELEXA) 10 MG tablet Take 1 tablet (10 mg total) by mouth daily. 30 tablet 0   metroNIDAZOLE (METROGEL) 0.75 % gel APPLY TO THE AFFECTED SKIN ON THE FACE TWICE DAILY.  0   Multiple Vitamin (ONE-A-DAY MENS PO) Take 1 tablet by mouth daily.     Omega-3 Fatty Acids (FISH OIL) 1000 MG CAPS Take by mouth.     omeprazole (PRILOSEC OTC) 20 MG tablet Take 20 mg by mouth daily.     Current Facility-Administered Medications on File Prior to Visit  Medication Dose Route Frequency Provider Last Rate Last Admin   0.9 %  sodium chloride infusion  500 mL Intravenous Once Lucio Edward T, MD        BP 130/80    Pulse 89    Temp 98.2 F (36.8 C) (Oral)    Ht 5' 8.25" (1.734 m)    Wt 200 lb (90.7 kg)    SpO2 98%    BMI 30.19 kg/m       Objective:   Physical Exam Vitals and nursing note reviewed.  Constitutional:      General: He is not in acute distress.    Appearance: Normal appearance. He is well-developed and normal weight.  HENT:     Head: Normocephalic and atraumatic.     Right Ear: Tympanic membrane, ear canal and external ear normal. There is no impacted cerumen.     Left Ear: Tympanic membrane, ear canal and external ear normal. There is no impacted cerumen.     Nose: Nose normal. No congestion or rhinorrhea.     Mouth/Throat:     Mouth: Mucous membranes are moist.     Pharynx: Oropharynx is clear. No oropharyngeal exudate or posterior oropharyngeal erythema.  Eyes:     General:        Right eye: No discharge.        Left eye: No discharge.      Extraocular Movements: Extraocular movements intact.     Conjunctiva/sclera: Conjunctivae normal.     Pupils: Pupils are equal, round, and reactive to light.  Neck:     Vascular: No carotid bruit.     Trachea: No tracheal deviation.  Cardiovascular:     Rate and Rhythm: Normal rate and regular rhythm.  Pulses: Normal pulses.     Heart sounds: Normal heart sounds. No murmur heard.   No friction rub. No gallop.  Pulmonary:     Effort: Pulmonary effort is normal. No respiratory distress.     Breath sounds: Normal breath sounds. No stridor. No wheezing, rhonchi or rales.  Chest:     Chest wall: No tenderness.  Abdominal:     General: Bowel sounds are normal. There is no distension.     Palpations: Abdomen is soft. There is no mass.     Tenderness: There is no abdominal tenderness. There is no right CVA tenderness, left CVA tenderness, guarding or rebound.     Hernia: No hernia is present.  Musculoskeletal:        General: No swelling, tenderness, deformity or signs of injury. Normal range of motion.     Right lower leg: No edema.     Left lower leg: No edema.  Lymphadenopathy:     Cervical: No cervical adenopathy.  Skin:    General: Skin is warm and dry.     Capillary Refill: Capillary refill takes less than 2 seconds.     Coloration: Skin is not jaundiced or pale.     Findings: No bruising, erythema, lesion or rash.  Neurological:     General: No focal deficit present.     Mental Status: He is alert and oriented to person, place, and time.     Cranial Nerves: No cranial nerve deficit.     Sensory: No sensory deficit.     Motor: No weakness.     Coordination: Coordination normal.     Gait: Gait normal.     Deep Tendon Reflexes: Reflexes normal.  Psychiatric:        Mood and Affect: Mood normal.        Behavior: Behavior normal.        Thought Content: Thought content normal.        Judgment: Judgment normal.      Assessment & Plan:  1. Routine general medical  examination at a health care facility - Continue to work on lifestyle modifications  - Follow up in one year or sooner if needed - CBC with Differential/Platelet; Future - Comprehensive metabolic panel; Future - Lipid panel; Future - TSH; Future  2. Anxiety - COntinue with Celexa 10 mg  - ALPRAZolam (XANAX) 0.5 MG tablet; Take 0.5 -1 tab 30 minutes prior to flight  Dispense: 10 tablet; Refill: 0  3. Mixed hyperlipidemia - Consider statin  - Continue with diet and exercise - CBC with Differential/Platelet; Future - Comprehensive metabolic panel; Future - Lipid panel; Future - TSH; Future  Dorothyann Peng, NP

## 2021-03-26 ENCOUNTER — Encounter: Payer: Self-pay | Admitting: Adult Health

## 2021-03-26 NOTE — Telephone Encounter (Signed)
FYI

## 2021-06-30 ENCOUNTER — Telehealth: Payer: Self-pay

## 2021-06-30 NOTE — Telephone Encounter (Signed)
---  took new mood and some ingredients are not supposed to be taken with celexia. no s/s  06/27/2021 1:44:21 PM Call [place] [facility] [place], RN, [facility] Advice Given Per Guideline CALL POISON CENTER NOW: * You need to call the [facility] now. Annetta NUMBER: [place]: [phone]  06/30/21 1434 - Pt took supplement & looked after the fact to make sure he could take it with Celexa & saw that he wasn't supposed to. After talking to Poison Control, "everything is fine" & he has no complaints, s/s at this time.

## 2021-10-07 ENCOUNTER — Other Ambulatory Visit: Payer: Self-pay | Admitting: Adult Health

## 2021-10-07 DIAGNOSIS — F419 Anxiety disorder, unspecified: Secondary | ICD-10-CM

## 2022-03-26 ENCOUNTER — Ambulatory Visit: Payer: No Typology Code available for payment source | Admitting: Adult Health

## 2022-03-26 ENCOUNTER — Encounter: Payer: Self-pay | Admitting: Adult Health

## 2022-03-26 VITALS — BP 138/82 | HR 85 | Temp 98.6°F | Ht 68.0 in | Wt 216.0 lb

## 2022-03-26 DIAGNOSIS — E782 Mixed hyperlipidemia: Secondary | ICD-10-CM | POA: Diagnosis not present

## 2022-03-26 DIAGNOSIS — F419 Anxiety disorder, unspecified: Secondary | ICD-10-CM

## 2022-03-26 DIAGNOSIS — Z0000 Encounter for general adult medical examination without abnormal findings: Secondary | ICD-10-CM | POA: Diagnosis not present

## 2022-03-26 LAB — TSH: TSH: 2.26 u[IU]/mL (ref 0.35–5.50)

## 2022-03-26 LAB — COMPREHENSIVE METABOLIC PANEL
ALT: 75 U/L — ABNORMAL HIGH (ref 0–53)
AST: 41 U/L — ABNORMAL HIGH (ref 0–37)
Albumin: 4.6 g/dL (ref 3.5–5.2)
Alkaline Phosphatase: 83 U/L (ref 39–117)
BUN: 16 mg/dL (ref 6–23)
CO2: 27 mEq/L (ref 19–32)
Calcium: 10.1 mg/dL (ref 8.4–10.5)
Chloride: 102 mEq/L (ref 96–112)
Creatinine, Ser: 1.08 mg/dL (ref 0.40–1.50)
GFR: 87.66 mL/min (ref >60.00)
Glucose, Bld: 96 mg/dL (ref 70–99)
Potassium: 4.1 mEq/L (ref 3.5–5.1)
Sodium: 139 mEq/L (ref 135–145)
Total Bilirubin: 0.9 mg/dL (ref 0.2–1.2)
Total Protein: 7.1 g/dL (ref 6.0–8.3)

## 2022-03-26 LAB — CBC
HCT: 42.6 % (ref 39.0–52.0)
Hemoglobin: 14.7 g/dL (ref 13.0–17.0)
MCHC: 34.6 g/dL (ref 30.0–36.0)
MCV: 90.5 fl (ref 78.0–100.0)
Platelets: 245 10*3/uL (ref 150.0–400.0)
RBC: 4.71 Mil/uL (ref 4.22–5.81)
RDW: 12.5 % (ref 11.5–15.5)
WBC: 6 10*3/uL (ref 4.0–10.5)

## 2022-03-26 LAB — LDL CHOLESTEROL, DIRECT: Direct LDL: 206 mg/dL

## 2022-03-26 LAB — LIPID PANEL
Cholesterol: 286 mg/dL — ABNORMAL HIGH (ref 0–200)
HDL: 42.3 mg/dL (ref >39.00)
NonHDL: 243.89
Total CHOL/HDL Ratio: 7
Triglycerides: 234 mg/dL — ABNORMAL HIGH (ref 0.0–149.0)
VLDL: 46.8 mg/dL — ABNORMAL HIGH (ref 0.0–40.0)

## 2022-03-26 MED ORDER — CITALOPRAM HYDROBROMIDE 20 MG PO TABS: 20.0000 mg | ORAL_TABLET | Freq: Every day | ORAL | 1 refills | Status: DC

## 2022-03-26 NOTE — Progress Notes (Signed)
Subjective:    Patient ID: Raymond Newman, male    DOB: December 01, 1985, 37 y.o.   MRN: [id number]  HPI Patient presents for yearly preventative medicine examination. He is a pleasant 37 year old male who  has a past medical history of Anxiety, GERD (gastroesophageal reflux disease), Rosacea, and Seasonal allergies.  He recently had a baby - two months ago.   Anxiety -takes Celexa 10 mg daily.He feels as though work stress and home stress with a new baby has caused his anxiety to increase.   Will take Xanax PRN for flying.   Hyperlipidemia -not currently on medication.  Lab Results  Component Value Date   CHOL 240 (H) 03/25/2021   HDL 33.90 (L) 03/25/2021   LDLCALC 147 (H) 06/29/2018   LDLDIRECT 168.0 03/25/2021   TRIG 218.0 (H) 03/25/2021   CHOLHDL 7 03/25/2021   All immunizations and health maintenance protocols were reviewed with the patient and needed orders were placed.  Appropriate screening laboratory values were ordered for the patient including screening of hyperlipidemia, renal function and hepatic function.  Medication reconciliation,  past medical history, social history, problem list and allergies were reviewed in detail with the patient  Goals were established with regard to weight loss, exercise, and  diet in compliance with medications. He is not exercising nor eating the healthiest. Weight is up 16 pounds over the last year  Wt Readings from Last 3 Encounters:  03/26/22 216 lb (98 kg)  03/25/21 200 lb (90.7 kg)  03/13/20 208 lb (94.3 kg)   Review of Systems  Constitutional: Negative.   HENT: Negative.    Eyes: Negative.   Respiratory: Negative.    Cardiovascular: Negative.   Gastrointestinal: Negative.   Endocrine: Negative.   Genitourinary: Negative.   Musculoskeletal: Negative.   Skin: Negative.   Allergic/Immunologic: Negative.   Neurological: Negative.   Hematological: Negative.   Psychiatric/Behavioral:  Positive for sleep disturbance. The patient is  nervous/anxious.   All other systems reviewed and are negative.  Past Medical History:  Diagnosis Date   Anxiety    with flying   GERD (gastroesophageal reflux disease)    Rosacea    Seasonal allergies     Social History   Socioeconomic History   Marital status: Married    Spouse name: Raquel Sarna   Number of children: 0   Years of education: Not on file   Highest education level: Not on file  Occupational History   Occupation: Chef    Comment: [employer]  Tobacco Use   Smoking status: Never   Smokeless tobacco: Never  Vaping Use   Vaping Use: Never used  Substance and Sexual Activity   Alcohol use: Yes    Alcohol/week: 0.0 standard drinks of alcohol    Comment: beer daily    Drug use: No   Sexual activity: Yes    Partners: Female  Other Topics Concern   Not on file  Social History Narrative   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[employer]    Has a girlfriend    Just bought a house in December       Social Determinants of Health   Financial Resource Strain: Not on [employer] Insecurity: Not on file  Transportation Needs: Not on file  Physical Activity: Not on file  Stress: Not on file  Social Connections: Not on file  Intimate Partner Violence: Not on file    Past Surgical History:  Procedure Laterality Date   [address] [place]  MONITOR  05/2017   Mostly sinus rhythm with sinus tachycardia.  Tachycardia associated with anxiety.  No notable PACs or PVCs noted.  No arrhythmia.   ESOPHAGOGASTRODUODENOSCOPY ENDOSCOPY     GRADUATED EXERCISE TOLERANCE TEST (GXT/ETT)  05/2017   Normal blood pressure response.  No ischemia.  No symptoms.  LOW RISK. Walked 13.5 minutes.  Achieved heart rate 187 bpm which is 90% max protected.  16.2 MET --minimal PVCs noted prior to and after completion of exercise.  No exertional PVCs.   TRANSTHORACIC ECHOCARDIOGRAM  05/2017   Normal LV size and function.  EF 60-65%.  No wall motion normality.  Normal diastolic function.  No valve  disease.     Family History  Problem Relation Age of Onset   Hypertension Father    Hyperlipidemia Father    Melanoma Mother    Lupus Mother    Hypercholesterolemia Paternal Grandfather    Colon cancer Neg Hx    Stomach cancer Neg Hx    Rectal cancer Neg Hx    Esophageal cancer Neg Hx     No Known Allergies  Current Outpatient Medications on File Prior to Visit  Medication Sig Dispense Refill   ALPRAZolam (XANAX) 0.5 MG tablet Take 0.5 -1 tab 30 minutes prior to flight 10 tablet 0   ibuprofen (ADVIL) 800 MG tablet Take 1 tablet by mouth every 8 (eight) hours.     metroNIDAZOLE (METROGEL) 0.75 % gel APPLY TO THE AFFECTED SKIN ON THE FACE TWICE DAILY.  0   Multiple Vitamin (ONE-A-DAY MENS PO) Take 1 tablet by mouth daily.     Omega-3 Fatty Acids (FISH OIL) 1000 MG CAPS Take by mouth.     omeprazole (PRILOSEC OTC) 20 MG tablet Take 20 mg by mouth daily.     No current facility-administered medications on file prior to visit.    BP 138/82   Pulse 85   Temp 98.6 F (37 C) (Oral)   Ht '5\' 8"'$  (1.727 m)   Wt 216 lb (98 kg)   SpO2 99%   BMI 32.84 kg/m       Objective:   Physical Exam Vitals and nursing note reviewed.  Constitutional:      General: He is not in acute distress.    Appearance: Normal appearance. He is obese. He is not ill-appearing.  HENT:     Head: Normocephalic and atraumatic.     Right Ear: Tympanic membrane, ear canal and external ear normal. There is no impacted cerumen.     Left Ear: Tympanic membrane, ear canal and external ear normal. There is no impacted cerumen.     Nose: Nose normal. No congestion or rhinorrhea.     Mouth/Throat:     Mouth: Mucous membranes are moist.     Pharynx: Oropharynx is clear.  Eyes:     Extraocular Movements: Extraocular movements intact.     Conjunctiva/sclera: Conjunctivae normal.     Pupils: Pupils are equal, round, and reactive to light.  Neck:     Vascular: No carotid bruit.  Cardiovascular:     Rate and  Rhythm: Normal rate and regular rhythm.     Pulses: Normal pulses.     Heart sounds: No murmur heard.    No friction rub. No gallop.  Pulmonary:     Effort: Pulmonary effort is normal.     Breath sounds: Normal breath sounds.  Abdominal:     General: Abdomen is flat. Bowel sounds are normal. There is no distension.  Palpations: Abdomen is soft. There is no mass.     Tenderness: There is no abdominal tenderness. There is no guarding or rebound.     Hernia: No hernia is present.  Musculoskeletal:        General: Normal range of motion.     Cervical back: Normal range of motion and neck supple.  Lymphadenopathy:     Cervical: No cervical adenopathy.  Skin:    General: Skin is warm and dry.     Capillary Refill: Capillary refill takes less than 2 seconds.  Neurological:     General: No focal deficit present.     Mental Status: He is alert and oriented to person, place, and time.  Psychiatric:        Mood and Affect: Mood normal.        Behavior: Behavior normal.        Thought Content: Thought content normal.        Judgment: Judgment normal.       Assessment & Plan:  1. Routine general medical examination at a health care facility Today patient counseled on age appropriate routine health concerns for screening and prevention, each reviewed and up to date or declined. Immunizations reviewed and up to date or declined. Labs ordered and reviewed. Risk factors for depression reviewed and negative. Hearing function and visual acuity are intact. ADLs screened and addressed as needed. Functional ability and level of safety reviewed and appropriate. Education, counseling and referrals performed based on assessed risks today. Patient provided with a copy of personalized plan for preventive services. - Follow up in one year or sooner if needed    2. Anxiety - Will increase Celexa to 20 mg. He will send me a mychart message to let me know how he is doing  - Lipid panel; Future - TSH;  Future - CBC; Future - Comprehensive metabolic panel; Future - citalopram (CELEXA) 20 MG tablet; Take 1 tablet (20 mg total) by mouth daily.  Dispense: 90 tablet; Refill: 1  3. Mixed hyperlipidemia - Consider statin  - Lipid panel; Future - TSH; Future - CBC; Future - Comprehensive metabolic panel; Future  Dorothyann Peng, NP

## 2022-03-30 ENCOUNTER — Telehealth: Payer: Self-pay | Admitting: Adult Health

## 2022-03-30 DIAGNOSIS — I10 Essential (primary) hypertension: Secondary | ICD-10-CM

## 2022-03-30 DIAGNOSIS — E782 Mixed hyperlipidemia: Secondary | ICD-10-CM

## 2022-03-30 MED ORDER — ATORVASTATIN CALCIUM 40 MG PO TABS: 40.0000 mg | ORAL_TABLET | Freq: Every day | ORAL | 3 refills | Status: DC

## 2022-03-30 MED ORDER — LISINOPRIL 10 MG PO TABS: 10.0000 mg | ORAL_TABLET | Freq: Every day | ORAL | 3 refills | Status: DC

## 2022-03-30 NOTE — Telephone Encounter (Signed)
Updated patient on labs.   Due to elevated cholesterol levels will add Lipitor 40 mg daily.   Will also start him on lisinopril 10 mg for hypertension

## 2022-09-17 ENCOUNTER — Other Ambulatory Visit: Payer: Self-pay | Admitting: Adult Health

## 2022-09-17 DIAGNOSIS — F419 Anxiety disorder, unspecified: Secondary | ICD-10-CM

## 2022-11-19 ENCOUNTER — Encounter: Payer: Self-pay | Admitting: Adult Health

## 2023-03-20 ENCOUNTER — Other Ambulatory Visit: Payer: Self-pay | Admitting: Adult Health

## 2023-03-20 DIAGNOSIS — F419 Anxiety disorder, unspecified: Secondary | ICD-10-CM

## 2023-03-24 ENCOUNTER — Other Ambulatory Visit: Payer: Self-pay | Admitting: Adult Health

## 2023-03-24 ENCOUNTER — Ambulatory Visit: Payer: No Typology Code available for payment source | Admitting: Adult Health

## 2023-03-24 DIAGNOSIS — F419 Anxiety disorder, unspecified: Secondary | ICD-10-CM

## 2023-03-24 MED ORDER — LISINOPRIL 10 MG PO TABS: 10.0000 mg | ORAL_TABLET | Freq: Every day | ORAL | 0 refills | Status: DC

## 2023-03-24 MED ORDER — CITALOPRAM HYDROBROMIDE 20 MG PO TABS: 20.0000 mg | ORAL_TABLET | Freq: Every day | ORAL | 0 refills | Status: DC

## 2023-03-24 MED ORDER — ATORVASTATIN CALCIUM 40 MG PO TABS: 40.0000 mg | ORAL_TABLET | Freq: Every day | ORAL | 0 refills | Status: DC

## 2023-03-25 NOTE — Progress Notes (Signed)
Entered in error no charge

## 2023-05-04 ENCOUNTER — Encounter: Payer: No Typology Code available for payment source | Admitting: Adult Health

## 2023-05-04 NOTE — Progress Notes (Deleted)
 Subjective:    Patient ID: Raymond Newman, male    DOB: 02/24/1985, 39 y.o.   MRN: [id number]  HPI Patient presents for yearly preventative medicine examination. He is a pleasant 38 year old male who  has a past medical history of Anxiety, GERD (gastroesophageal reflux disease), Rosacea, and Seasonal allergies.  Anxiety -takes Celexa 20 mg daily.He feels as though work stress and home stress with a new baby has caused his anxiety to increase.   Will take Xanax PRN for flying.   Hyperlipidemia -managed with Lipitor 40 mg daily  Lab Results  Component Value Date   CHOL 286 (H) 03/26/2022   HDL 42.30 03/26/2022   LDLCALC 147 (H) 06/29/2018   LDLDIRECT 206.0 03/26/2022   TRIG 234.0 (H) 03/26/2022   CHOLHDL 7 03/26/2022   Hypertension - managed wit lisinopril 10 mg daily. He denies myalgia or fatigue  BP Readings from Last 3 Encounters:  03/26/22 138/82  03/25/21 130/80  03/13/20 (!) 150/90   Obesity -  Wt Readings from Last 3 Encounters:  03/26/22 216 lb (98 kg)  03/25/21 200 lb (90.7 kg)  03/13/20 208 lb (94.3 kg)    All immunizations and health maintenance protocols were reviewed with the patient and needed orders were placed.  Appropriate screening laboratory values were ordered for the patient including screening of hyperlipidemia, renal function and hepatic function.   Medication reconciliation,  past medical history, social history, problem list and allergies were reviewed in detail with the patient  Goals were established with regard to weight loss, exercise, and  diet in compliance with medications  Review of Systems  Constitutional: Negative.   HENT: Negative.    Eyes: Negative.   Respiratory: Negative.    Cardiovascular: Negative.   Gastrointestinal: Negative.   Endocrine: Negative.   Genitourinary: Negative.   Musculoskeletal: Negative.   Skin: Negative.   Allergic/Immunologic: Negative.   Neurological: Negative.   Hematological: Negative.    Psychiatric/Behavioral: Negative.    All other systems reviewed and are negative.  Past Medical History:  Diagnosis Date   Anxiety    with flying   GERD (gastroesophageal reflux disease)    Rosacea    Seasonal allergies     Social History   Socioeconomic History   Marital status: Married    Spouse name: Irving Burton   Number of children: 0   Years of education: Not on file   Highest education level: Not on file  Occupational History   Occupation: Chef    Comment: [employer]  Tobacco Use   Smoking status: Never   Smokeless tobacco: Never  Vaping Use   Vaping status: Never Used  Substance and Sexual Activity   Alcohol use: Yes    Alcohol/week: 0.0 standard drinks of alcohol    Comment: beer daily    Drug use: No   Sexual activity: Yes    Partners: Female  Other Topics Concern   Not on file  Social History Narrative   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[employer]    Has a girlfriend    Just bought a house in December       Social Drivers of Health   Financial Resource Strain: Not on File (01/13/2023)   Received from [employer] Resource Strain: 0  Food Insecurity: Not on File (01/13/2023)   Received from [employer]    Food: 0  Transportation Needs: Not on File (01/13/2023)  Received from [employer]: 0  Physical Activity: Not on File (01/13/2023)   Received from [facility]   Physical Activity    Physical Activity: 0  Stress: Not on File (01/13/2023)   Received from [facility]   Stress    Stress: 0  Social Connections: Not on File (01/13/2023)   Received from [facility]   Social Connections    Connectedness: 0  Intimate Partner Violence: Not on file    Past Surgical History:  Procedure Laterality Date   48 HR HOLTER MONITOR  05/2017   Mostly sinus rhythm with sinus tachycardia.  Tachycardia associated with anxiety.  No notable PACs or PVCs noted.  No arrhythmia.    ESOPHAGOGASTRODUODENOSCOPY ENDOSCOPY     GRADUATED EXERCISE TOLERANCE TEST (GXT/ETT)  05/2017   Normal blood pressure response.  No ischemia.  No symptoms.  LOW RISK. Walked 13.5 minutes.  Achieved heart rate 187 bpm which is 90% max protected.  16.2 MET --minimal PVCs noted prior to and after completion of exercise.  No exertional PVCs.   TRANSTHORACIC ECHOCARDIOGRAM  05/2017   Normal LV size and function.  EF 60-65%.  No wall motion normality.  Normal diastolic function.  No valve disease.     Family History  Problem Relation Age of Onset   Hypertension Father    Hyperlipidemia Father    Melanoma Mother    Lupus Mother    Hypercholesterolemia Paternal Grandfather    Colon cancer Neg Hx    Stomach cancer Neg Hx    Rectal cancer Neg Hx    Esophageal cancer Neg Hx     No Known Allergies  Current Outpatient Medications on File Prior to Visit  Medication Sig Dispense Refill   ALPRAZolam (XANAX) 0.5 MG tablet Take 0.5 -1 tab 30 minutes prior to flight 10 tablet 0   atorvastatin (LIPITOR) 40 MG tablet Take 1 tablet (40 mg total) by mouth daily. 90 tablet 0   citalopram (CELEXA) 20 MG tablet Take 1 tablet (20 mg total) by mouth daily. 90 tablet 0   ibuprofen (ADVIL) 800 MG tablet Take 1 tablet by mouth every 8 (eight) hours.     lisinopril (ZESTRIL) 10 MG tablet Take 1 tablet (10 mg total) by mouth daily. 90 tablet 0   metroNIDAZOLE (METROGEL) 0.75 % gel APPLY TO THE AFFECTED SKIN ON THE FACE TWICE DAILY.  0   Multiple Vitamin (ONE-A-DAY MENS PO) Take 1 tablet by mouth daily.     Omega-3 Fatty Acids (FISH OIL) 1000 MG CAPS Take by mouth.     omeprazole (PRILOSEC OTC) 20 MG tablet Take 20 mg by mouth daily.     No current facility-administered medications on file prior to visit.    There were no vitals taken for this visit.      Objective:   Physical Exam Vitals and nursing note reviewed.  Constitutional:      General: He is not in acute distress.    Appearance: Normal  appearance. He is not ill-appearing.  HENT:     Head: Normocephalic and atraumatic.     Right Ear: Tympanic membrane, ear canal and external ear normal. There is no impacted cerumen.     Left Ear: Tympanic membrane, ear canal and external ear normal. There is no impacted cerumen.     Nose: Nose normal. No congestion or rhinorrhea.     Mouth/Throat:     Mouth: Mucous membranes are moist.     Pharynx: Oropharynx is  clear.  Eyes:     Extraocular Movements: Extraocular movements intact.     Conjunctiva/sclera: Conjunctivae normal.     Pupils: Pupils are equal, round, and reactive to light.  Neck:     Vascular: No carotid bruit.  Cardiovascular:     Rate and Rhythm: Normal rate and regular rhythm.     Pulses: Normal pulses.     Heart sounds: No murmur heard.    No friction rub. No gallop.  Pulmonary:     Effort: Pulmonary effort is normal.     Breath sounds: Normal breath sounds.  Abdominal:     General: Abdomen is flat. Bowel sounds are normal. There is no distension.     Palpations: Abdomen is soft. There is no mass.     Tenderness: There is no abdominal tenderness. There is no guarding or rebound.     Hernia: No hernia is present.  Musculoskeletal:        General: Normal range of motion.     Cervical back: Normal range of motion and neck supple.  Lymphadenopathy:     Cervical: No cervical adenopathy.  Skin:    General: Skin is warm and dry.     Capillary Refill: Capillary refill takes less than 2 seconds.  Neurological:     General: No focal deficit present.     Mental Status: He is alert and oriented to person, place, and time.  Psychiatric:        Mood and Affect: Mood normal.        Behavior: Behavior normal.        Thought Content: Thought content normal.        Judgment: Judgment normal.           Assessment & Plan:

## 2023-06-08 ENCOUNTER — Encounter: Payer: Self-pay | Admitting: Adult Health

## 2023-06-08 ENCOUNTER — Ambulatory Visit (INDEPENDENT_AMBULATORY_CARE_PROVIDER_SITE_OTHER): Admitting: Adult Health

## 2023-06-08 VITALS — BP 138/78 | HR 108 | Temp 98.1°F | Ht 68.0 in | Wt 213.0 lb

## 2023-06-08 DIAGNOSIS — K219 Gastro-esophageal reflux disease without esophagitis: Secondary | ICD-10-CM

## 2023-06-08 DIAGNOSIS — E782 Mixed hyperlipidemia: Secondary | ICD-10-CM | POA: Diagnosis not present

## 2023-06-08 DIAGNOSIS — F419 Anxiety disorder, unspecified: Secondary | ICD-10-CM

## 2023-06-08 DIAGNOSIS — I10 Essential (primary) hypertension: Secondary | ICD-10-CM | POA: Diagnosis not present

## 2023-06-08 DIAGNOSIS — Z0000 Encounter for general adult medical examination without abnormal findings: Secondary | ICD-10-CM

## 2023-06-08 LAB — CBC
HCT: 45 % (ref 39.0–52.0)
Hemoglobin: 15.2 g/dL (ref 13.0–17.0)
MCHC: 33.7 g/dL (ref 30.0–36.0)
MCV: 91.1 fl (ref 78.0–100.0)
Platelets: 262 10*3/uL (ref 150.0–400.0)
RBC: 4.94 Mil/uL (ref 4.22–5.81)
RDW: 12.4 % (ref 11.5–15.5)
WBC: 7.8 10*3/uL (ref 4.0–10.5)

## 2023-06-08 LAB — LIPID PANEL
Cholesterol: 220 mg/dL — ABNORMAL HIGH (ref 0–200)
HDL: 41.4 mg/dL (ref >39.00)
LDL Cholesterol: 107 mg/dL — ABNORMAL HIGH (ref 0–99)
NonHDL: 178.54
Total CHOL/HDL Ratio: 5
Triglycerides: 359 mg/dL — ABNORMAL HIGH (ref 0.0–149.0)
VLDL: 71.8 mg/dL — ABNORMAL HIGH (ref 0.0–40.0)

## 2023-06-08 LAB — COMPREHENSIVE METABOLIC PANEL WITH GFR
ALT: 58 U/L — ABNORMAL HIGH (ref 0–53)
AST: 26 U/L (ref 0–37)
Albumin: 4.9 g/dL (ref 3.5–5.2)
Alkaline Phosphatase: 100 U/L (ref 39–117)
BUN: 16 mg/dL (ref 6–23)
CO2: 28 meq/L (ref 19–32)
Calcium: 10.3 mg/dL (ref 8.4–10.5)
Chloride: 100 meq/L (ref 96–112)
Creatinine, Ser: 1.07 mg/dL (ref 0.40–1.50)
GFR: 87.9 mL/min (ref >60.00)
Glucose, Bld: 99 mg/dL (ref 70–99)
Potassium: 4.6 meq/L (ref 3.5–5.1)
Sodium: 138 meq/L (ref 135–145)
Total Bilirubin: 1 mg/dL (ref 0.2–1.2)
Total Protein: 7 g/dL (ref 6.0–8.3)

## 2023-06-08 LAB — TSH: TSH: 1.77 u[IU]/mL (ref 0.35–5.50)

## 2023-06-08 MED ORDER — OMEPRAZOLE MAGNESIUM 20 MG PO TBEC: 20.0000 mg | DELAYED_RELEASE_TABLET | Freq: Every day | ORAL | 3 refills | Status: AC

## 2023-06-08 NOTE — Progress Notes (Signed)
 Subjective:    Patient ID: Raymond Newman, male    DOB: January 27, 1986, 38 y.o.   MRN: [id number]  HPI Patient presents for yearly preventative medicine examination. He is a pleasant 38 year old male who  has a past medical history of Anxiety, GERD (gastroesophageal reflux disease), Rosacea, and Seasonal allergies.  Anxiety -takes Celexa  20 mg daily. We increased his dose about two months ago and has noticed improvement in his symptoms  Will take Xanax  PRN for flying.   Hyperlipidemia -managed with Lipitor 40 mg daily. He denies mylagia or fatigue  Lab Results  Component Value Date   CHOL 286 (H) 03/26/2022   HDL 42.30 03/26/2022   LDLCALC 147 (H) 06/29/2018   LDLDIRECT 206.0 03/26/2022   TRIG 234.0 (H) 03/26/2022   CHOLHDL 7 03/26/2022   Hypertension - was started on lisinopril  10 mg about 2.5 months ago. He denies dizziness, lightheadedness, blurred vision or headaches. He has not check blood pressure at home.   BP Readings from Last 3 Encounters:  06/08/23 138/78  03/26/22 138/82  03/25/21 130/80   GERD - takes prilosec 20 mg daily. Feels controlled.   All immunizations and health maintenance protocols were reviewed with the patient and needed orders were placed.  Appropriate screening laboratory values were ordered for the patient including screening of hyperlipidemia, renal function and hepatic function.  Medication reconciliation,  past medical history, social history, problem list and allergies were reviewed in detail with the patient  Goals were established with regard to weight loss, exercise, and  diet in compliance with medications. He has cut out a lot of alcohol and doing some physical activity.  Wt Readings from Last 3 Encounters:  06/08/23 213 lb (96.6 kg)  03/26/22 216 lb (98 kg)  03/25/21 200 lb (90.7 kg)   Review of Systems  Constitutional: Negative.   HENT: Negative.    Eyes: Negative.   Respiratory: Negative.    Cardiovascular: Negative.    Gastrointestinal: Negative.   Endocrine: Negative.   Genitourinary: Negative.   Musculoskeletal: Negative.   Skin: Negative.   Allergic/Immunologic: Negative.   Neurological: Negative.   Hematological: Negative.   Psychiatric/Behavioral: Negative.    All other systems reviewed and are negative.  Past Medical History:  Diagnosis Date   Anxiety    with flying   GERD (gastroesophageal reflux disease)    Rosacea    Seasonal allergies     Social History   Socioeconomic History   Marital status: Married    Spouse name: Sherline Distel   Number of children: 0   Years of education: Not on file   Highest education level: Not on file  Occupational History   Occupation: Chef    Comment: [employer]  Tobacco Use   Smoking status: Never   Smokeless tobacco: Never  Vaping Use   Vaping status: Never Used  Substance and Sexual Activity   Alcohol use: Yes    Alcohol/week: 0.0 standard drinks of alcohol    Comment: beer daily    Drug use: No   Sexual activity: Yes    Partners: Female  Other Topics Concern   Not on file  Social History Narrative   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[employer]    Has a girlfriend    Just bought a house in December       Social Drivers of Health   Financial Resource Strain: Not on File (01/13/2023)   Received from [employer]  Financial Resource Strain: 0  Food Insecurity: Not on File (01/13/2023)   Received from [employer]    Food: 0  Transportation Needs: Not on File (01/13/2023)   Received from [employer] Needs    Transportation: 0  Physical Activity: Not on File (01/13/2023)   Received from [facility]   Physical Activity    Physical Activity: 0  Stress: Not on File (01/13/2023)   Received from [facility]   Stress    Stress: 0  Social Connections: Not on File (01/13/2023)   Received from [employer]   Social Connections    Connectedness: 0  Intimate Partner Violence: Not on file    Past  Surgical History:  Procedure Laterality Date   48 HR HOLTER MONITOR  05/2017   Mostly sinus rhythm with sinus tachycardia.  Tachycardia associated with anxiety.  No notable PACs or PVCs noted.  No arrhythmia.   ESOPHAGOGASTRODUODENOSCOPY ENDOSCOPY     GRADUATED EXERCISE TOLERANCE TEST (GXT/ETT)  05/2017   Normal blood pressure response.  No ischemia.  No symptoms.  LOW RISK. Walked 13.5 minutes.  Achieved heart rate 187 bpm which is 90% max protected.  16.2 MET --minimal PVCs noted prior to and after completion of exercise.  No exertional PVCs.   TRANSTHORACIC ECHOCARDIOGRAM  05/2017   Normal LV size and function.  EF 60-65%.  No wall motion normality.  Normal diastolic function.  No valve disease.     Family History  Problem Relation Age of Onset   Hypertension Father    Hyperlipidemia Father    Melanoma Mother    Lupus Mother    Hypercholesterolemia Paternal Grandfather    Colon cancer Neg Hx    Stomach cancer Neg Hx    Rectal cancer Neg Hx    Esophageal cancer Neg Hx     No Known Allergies  Current Outpatient Medications on File Prior to Visit  Medication Sig Dispense Refill   ALPRAZolam  (XANAX ) 0.5 MG tablet Take 0.5 -1 tab 30 minutes prior to flight 10 tablet 0   atorvastatin  (LIPITOR) 40 MG tablet Take 1 tablet (40 mg total) by mouth daily. 90 tablet 0   citalopram  (CELEXA ) 20 MG tablet Take 1 tablet (20 mg total) by mouth daily. 90 tablet 0   ibuprofen (ADVIL) 800 MG tablet Take 1 tablet by mouth every 8 (eight) hours.     lisinopril  (ZESTRIL ) 10 MG tablet Take 1 tablet (10 mg total) by mouth daily. 90 tablet 0   metroNIDAZOLE (METROGEL) 0.75 % gel APPLY TO THE AFFECTED SKIN ON THE FACE TWICE DAILY.  0   Multiple Vitamin (ONE-A-DAY MENS PO) Take 1 tablet by mouth daily.     Omega-3 Fatty Acids (FISH OIL) 1000 MG CAPS Take by mouth.     No current facility-administered medications on file prior to visit.    BP 138/78   Pulse (!) 108   Temp 98.1 F (36.7 C) (Oral)    Ht 5\' 8"  (1.727 m)   Wt 213 lb (96.6 kg)   SpO2 95%   BMI 32.39 kg/m       Objective:   Physical Exam Vitals and nursing note reviewed.  Constitutional:      General: He is not in acute distress.    Appearance: Normal appearance. He is not ill-appearing.  HENT:     Head: Normocephalic and atraumatic.     Right Ear: Tympanic membrane, ear canal and external ear normal. There is no impacted cerumen.  Left Ear: Tympanic membrane, ear canal and external ear normal. There is no impacted cerumen.     Nose: Nose normal. No congestion or rhinorrhea.     Mouth/Throat:     Mouth: Mucous membranes are moist.     Pharynx: Oropharynx is clear.  Eyes:     Extraocular Movements: Extraocular movements intact.     Conjunctiva/sclera: Conjunctivae normal.     Pupils: Pupils are equal, round, and reactive to light.  Neck:     Vascular: No carotid bruit.  Cardiovascular:     Rate and Rhythm: Normal rate and regular rhythm.     Pulses: Normal pulses.     Heart sounds: No murmur heard.    No friction rub. No gallop.  Pulmonary:     Effort: Pulmonary effort is normal.     Breath sounds: Normal breath sounds.  Abdominal:     General: Abdomen is flat. Bowel sounds are normal. There is no distension.     Palpations: Abdomen is soft. There is no mass.     Tenderness: There is no abdominal tenderness. There is no guarding or rebound.     Hernia: No hernia is present.  Musculoskeletal:        General: Normal range of motion.     Cervical back: Normal range of motion and neck supple.  Lymphadenopathy:     Cervical: No cervical adenopathy.  Skin:    General: Skin is warm and dry.     Capillary Refill: Capillary refill takes less than 2 seconds.  Neurological:     General: No focal deficit present.     Mental Status: He is alert and oriented to person, place, and time.  Psychiatric:        Mood and Affect: Mood normal.        Behavior: Behavior normal.        Thought Content: Thought  content normal.        Judgment: Judgment normal.       Assessment & Plan:  1. Routine general medical examination at a health care facility (Primary) Today patient counseled on age appropriate routine health concerns for screening and prevention, each reviewed and up to date or declined. Immunizations reviewed and up to date or declined. Labs ordered and reviewed. Risk factors for depression reviewed and negative. Hearing function and visual acuity are intact. ADLs screened and addressed as needed. Functional ability and level of safety reviewed and appropriate. Education, counseling and referrals performed based on assessed risks today. Patient provided with a copy of personalized plan for preventive services. - Continue to work on weight loss through lifestyle modifications  - Follow up in one year or sooner if needed  2. Anxiety - Controlled. Continue with Celexa  20 mg daily.  - Lipid panel; Future - TSH; Future - CBC; Future - Comprehensive metabolic panel with GFR; Future  3. Mixed hyperlipidemia - Consider dose change of statin  - Lipid panel; Future - TSH; Future - CBC; Future - Comprehensive metabolic panel with GFR; Future  4. Primary hypertension - I would like him to check his BP at home and send me results  - Lipid panel; Future - TSH; Future - CBC; Future - Comprehensive metabolic panel with GFR; Future  5. Gastroesophageal reflux disease without esophagitis  - omeprazole (PRILOSEC OTC) 20 MG tablet; Take 1 tablet (20 mg total) by mouth daily.  Dispense: 90 tablet; Refill: 3  Jeremyah Jelley, NP

## 2023-06-19 ENCOUNTER — Other Ambulatory Visit: Payer: Self-pay | Admitting: Adult Health

## 2023-06-19 DIAGNOSIS — F419 Anxiety disorder, unspecified: Secondary | ICD-10-CM

## 2023-09-17 ENCOUNTER — Other Ambulatory Visit: Payer: Self-pay | Admitting: Adult Health

## 2023-09-17 DIAGNOSIS — F419 Anxiety disorder, unspecified: Secondary | ICD-10-CM

## 2023-09-20 ENCOUNTER — Other Ambulatory Visit: Payer: Self-pay | Admitting: Adult Health

## 2023-09-20 DIAGNOSIS — F419 Anxiety disorder, unspecified: Secondary | ICD-10-CM

## 2023-09-21 ENCOUNTER — Other Ambulatory Visit: Payer: Self-pay | Admitting: Adult Health

## 2023-09-21 DIAGNOSIS — F419 Anxiety disorder, unspecified: Secondary | ICD-10-CM

## 2023-09-21 NOTE — Telephone Encounter (Unsigned)
 Copied from CRM #[id number]. Topic: Clinical - Medication Refill >> Sep 21, 2023 11:15 AM Suzen RAMAN wrote: Medication:  Atorvastatin  Calcium  40 MG TAKE 1 TABLET(40 MG) BY MOUTH DAILY Lisinopril  10 MG TAKE 1 TABLET(10 MG) BY MOUTH DAILY Citalopram  Hydrobromide 20 MG    Has the patient contacted their pharmacy? Yes   This is the patient's preferred pharmacy:   [id number] - [place], [place] - [address] AT [facility] [address] [place] [address] Phone: [phone] Fax: [phone]  Is this the correct pharmacy for this prescription? Yes If no, delete pharmacy and type the correct one.   Has the prescription been filled recently? Yes  Is the patient out of the medication? Yes  Has the patient been seen for an appointment in the last year OR does the patient have an upcoming appointment? Yes  Can we respond through MyChart? Yes  Agent: Please be advised that Rx refills may take up to 3 business days. We ask that you follow-up with your pharmacy.

## 2023-09-22 MED ORDER — LISINOPRIL 10 MG PO TABS: ORAL_TABLET | ORAL | 0 refills | Status: DC

## 2023-09-22 MED ORDER — ATORVASTATIN CALCIUM 40 MG PO TABS: ORAL_TABLET | ORAL | 0 refills | Status: DC

## 2023-09-22 MED ORDER — CITALOPRAM HYDROBROMIDE 20 MG PO TABS: ORAL_TABLET | ORAL | 0 refills | Status: DC

## 2023-11-28 ENCOUNTER — Encounter: Payer: Self-pay | Admitting: Adult Health

## 2023-11-29 ENCOUNTER — Other Ambulatory Visit: Payer: Self-pay | Admitting: Adult Health

## 2023-11-29 DIAGNOSIS — F419 Anxiety disorder, unspecified: Secondary | ICD-10-CM

## 2023-11-29 MED ORDER — ALPRAZOLAM 0.5 MG PO TABS: ORAL_TABLET | ORAL | 2 refills | Status: AC

## 2023-11-29 NOTE — Telephone Encounter (Signed)
**Note De-identified  [place] Obfuscation** Please advise 

## 2023-12-21 ENCOUNTER — Other Ambulatory Visit: Payer: Self-pay | Admitting: Adult Health

## 2023-12-21 DIAGNOSIS — F419 Anxiety disorder, unspecified: Secondary | ICD-10-CM

## 2023-12-21 MED ORDER — CITALOPRAM HYDROBROMIDE 20 MG PO TABS: ORAL_TABLET | ORAL | 0 refills | Status: AC

## 2023-12-21 MED ORDER — ATORVASTATIN CALCIUM 40 MG PO TABS: ORAL_TABLET | ORAL | 0 refills | Status: AC

## 2023-12-21 MED ORDER — LISINOPRIL 10 MG PO TABS: ORAL_TABLET | ORAL | 0 refills | Status: AC

## 2023-12-21 NOTE — Telephone Encounter (Unsigned)
 Copied from CRM #[id number]. Topic: Clinical - Medication Refill >> Dec 21, 2023  9:48 AM Frederich PARAS wrote: Medication: citalopram  (CELEXA ) 20 MG tablet, atorvastatin  (LIPITOR) 40 MG tablet, lisinopril  (ZESTRIL ) 10 MG tablet     Has the patient contacted their pharmacy? Yes They told pt they were waiting on approval  This is the patient's preferred pharmacy:    [facility] #[id number] - [place], [place] - [address] AT [facility] OF [address] [place] [id number] Phone: [phone] Fax: [phone]   Is this the correct pharmacy for this prescription? Yes If no, delete pharmacy and type the correct one.   Has the prescription been filled recently? Yes  Is the patient out of the medication? Yes, patient out of meds completely   Has the patient been seen for an appointment in the last year OR does the patient have an upcoming appointment? Yes  Can we respond through MyChart? Yes  Agent: Please be advised that Rx refills may take up to 3 business days. We ask that you follow-up with your pharmacy.
